# Patient Record
Sex: Female | Born: 1970 | Race: Black or African American | Hispanic: No | Marital: Married | Smoking: Never smoker
Health system: Southern US, Community
[De-identification: ages and names within clinical notes are randomized; demographics above are authoritative.]

## PROBLEM LIST (undated history)

## (undated) DIAGNOSIS — I10 Essential (primary) hypertension: Secondary | ICD-10-CM

## (undated) HISTORY — DX: Essential (primary) hypertension: I10

---

## 1998-03-14 ENCOUNTER — Emergency Department (HOSPITAL_COMMUNITY): Admission: EM | Admit: 1998-03-14 | Discharge: 1998-03-14 | Payer: Self-pay | Admitting: Emergency Medicine

## 1998-05-08 ENCOUNTER — Ambulatory Visit (HOSPITAL_COMMUNITY): Admission: RE | Admit: 1998-05-08 | Discharge: 1998-05-08 | Payer: Self-pay | Admitting: *Deleted

## 1998-07-01 ENCOUNTER — Encounter: Payer: Self-pay | Admitting: *Deleted

## 1998-07-01 ENCOUNTER — Ambulatory Visit (HOSPITAL_COMMUNITY): Admission: RE | Admit: 1998-07-01 | Discharge: 1998-07-01 | Payer: Self-pay | Admitting: *Deleted

## 1998-07-06 HISTORY — PX: TUBAL LIGATION: SHX77

## 1998-07-10 ENCOUNTER — Ambulatory Visit (HOSPITAL_COMMUNITY): Admission: RE | Admit: 1998-07-10 | Discharge: 1998-07-10 | Payer: Self-pay | Admitting: *Deleted

## 1998-10-20 ENCOUNTER — Inpatient Hospital Stay (HOSPITAL_COMMUNITY): Admission: AD | Admit: 1998-10-20 | Discharge: 1998-10-20 | Payer: Self-pay | Admitting: *Deleted

## 1998-10-21 ENCOUNTER — Inpatient Hospital Stay (HOSPITAL_COMMUNITY): Admission: AD | Admit: 1998-10-21 | Discharge: 1998-10-23 | Payer: Self-pay | Admitting: *Deleted

## 1999-04-29 ENCOUNTER — Encounter: Payer: Self-pay | Admitting: Emergency Medicine

## 1999-04-29 ENCOUNTER — Emergency Department (HOSPITAL_COMMUNITY): Admission: EM | Admit: 1999-04-29 | Discharge: 1999-04-29 | Payer: Self-pay | Admitting: Emergency Medicine

## 2001-02-28 ENCOUNTER — Emergency Department (HOSPITAL_COMMUNITY): Admission: EM | Admit: 2001-02-28 | Discharge: 2001-02-28 | Payer: Self-pay | Admitting: Emergency Medicine

## 2001-03-02 ENCOUNTER — Emergency Department (HOSPITAL_COMMUNITY): Admission: EM | Admit: 2001-03-02 | Discharge: 2001-03-02 | Payer: Self-pay | Admitting: Emergency Medicine

## 2001-03-08 ENCOUNTER — Encounter: Admission: RE | Admit: 2001-03-08 | Discharge: 2001-03-08 | Payer: Self-pay | Admitting: Family Medicine

## 2004-09-12 ENCOUNTER — Ambulatory Visit: Payer: Self-pay | Admitting: Family Medicine

## 2004-10-13 ENCOUNTER — Ambulatory Visit: Payer: Self-pay | Admitting: Family Medicine

## 2004-10-14 ENCOUNTER — Encounter: Admission: RE | Admit: 2004-10-14 | Discharge: 2004-10-14 | Payer: Self-pay | Admitting: Sports Medicine

## 2008-11-20 ENCOUNTER — Encounter (INDEPENDENT_AMBULATORY_CARE_PROVIDER_SITE_OTHER): Payer: Self-pay | Admitting: *Deleted

## 2008-11-28 ENCOUNTER — Encounter: Payer: Self-pay | Admitting: Family Medicine

## 2008-11-28 ENCOUNTER — Ambulatory Visit: Payer: Self-pay | Admitting: Family Medicine

## 2008-11-28 DIAGNOSIS — R350 Frequency of micturition: Secondary | ICD-10-CM

## 2008-11-30 ENCOUNTER — Encounter: Payer: Self-pay | Admitting: Family Medicine

## 2009-10-22 ENCOUNTER — Emergency Department (HOSPITAL_COMMUNITY): Admission: EM | Admit: 2009-10-22 | Discharge: 2009-10-22 | Payer: Self-pay | Admitting: Emergency Medicine

## 2010-09-23 LAB — CBC
HCT: 40.9 % (ref 36.0–46.0)
Hemoglobin: 13.9 g/dL (ref 12.0–15.0)
MCHC: 34.1 g/dL (ref 30.0–36.0)
MCV: 92.5 fL (ref 78.0–100.0)
Platelets: 282 10*3/uL (ref 150–400)
RBC: 4.42 MIL/uL (ref 3.87–5.11)
RDW: 12.6 % (ref 11.5–15.5)
WBC: 14 10*3/uL — ABNORMAL HIGH (ref 4.0–10.5)

## 2010-09-23 LAB — POCT CARDIAC MARKERS: Troponin i, poc: <0.05 ng/mL (ref 0.00–0.09)

## 2010-09-24 ENCOUNTER — Other Ambulatory Visit: Payer: Self-pay | Admitting: Family Medicine

## 2010-09-24 ENCOUNTER — Ambulatory Visit (INDEPENDENT_AMBULATORY_CARE_PROVIDER_SITE_OTHER): Payer: BC Managed Care – PPO | Admitting: Family Medicine

## 2010-09-24 ENCOUNTER — Encounter: Payer: Self-pay | Admitting: Family Medicine

## 2010-09-24 DIAGNOSIS — J309 Allergic rhinitis, unspecified: Secondary | ICD-10-CM | POA: Insufficient documentation

## 2010-09-24 DIAGNOSIS — Z23 Encounter for immunization: Secondary | ICD-10-CM

## 2010-09-24 DIAGNOSIS — Z1231 Encounter for screening mammogram for malignant neoplasm of breast: Secondary | ICD-10-CM

## 2010-09-24 DIAGNOSIS — N9089 Other specified noninflammatory disorders of vulva and perineum: Secondary | ICD-10-CM

## 2010-09-24 DIAGNOSIS — Z0000 Encounter for general adult medical examination without abnormal findings: Secondary | ICD-10-CM

## 2010-09-24 DIAGNOSIS — N907 Vulvar cyst: Secondary | ICD-10-CM

## 2010-09-24 LAB — LIPID PANEL
Cholesterol: 208 mg/dL — ABNORMAL HIGH (ref 0–200)
HDL: 58 mg/dL (ref >39)
Total CHOL/HDL Ratio: 3.6 Ratio
Triglycerides: 140 mg/dL (ref <150)

## 2010-09-24 LAB — TSH: TSH: 1.994 u[IU]/mL (ref 0.350–4.500)

## 2010-09-24 MED ORDER — FLUTICASONE FUROATE 27.5 MCG/SPRAY NA SUSP: NASAL | Status: DC

## 2010-09-24 NOTE — Progress Notes (Signed)
  Subjective:    Patient ID: Sara Dorsey, female    DOB: 09-Nov-1970, 40 y.o.   MRN: [id number]  HPI  Here for CPE Only issues: 1) allergy symptoms in last 1-2 months. Has not reallt been bothered by them much befoe this. Is taking allegra d with some benefit 2) boil on her genital area--has been there year or longer. muldly tender. No d/c  Review of Systems    copmplete 14point ros is negative except as in hpi Objective:   Physical Exam  Constitutional: She appears well-developed and well-nourished.  HENT:  Right Ear: External ear normal.  Left Ear: External ear normal.  Eyes: Conjunctivae are normal. Pupils are equal, round, and reactive to light.  Neck: Normal range of motion. Neck supple.  Cardiovascular: Normal rate, normal heart sounds and intact distal pulses.   Pulmonary/Chest: Effort normal and breath sounds normal.       Breasts bilaterally are symmertical, mild fibrocystic changes, no worrisome masses. Normal nipple and areola. Normal axilla  Abdominal: Soft. Bowel sounds are normal.  Genitourinary:       Deferred pelvic exam (on menses), not due pap. Left labia majora has 6 mm mildly tender nodule.  Skin: Skin is warm and dry.       Complete skin exam negative for worrisome lesions          Assessment & Plan:

## 2010-09-24 NOTE — Patient Instructions (Signed)
It was great to see you!! Please set up your mammogram at your convenience. Let's try you on some Zyrtec D instead of the allegra D--it is once a day.  I am also starting you on a nasal spray--one spray in each nostril twice a day. I have called it in to your pharmacy. Let me see you back in 4 weeks to evaluate that again--and also to recheck your blood pressure. I will send you a note about your lab work. On the way out, please make an appointment for [facility] here--it is usually on a Thursday morning--you will need a 30 minute appt. We will remove the cyst then.

## 2010-09-24 NOTE — Assessment & Plan Note (Signed)
Will switch to zyrtec D, add fluticasone nasal and rtc 1 m

## 2010-09-24 NOTE — Assessment & Plan Note (Signed)
Breast exam. Info given for mammo Pap in 2010 normal. Discussed exercise checklabs Taking appropriate calcium

## 2010-09-24 NOTE — Assessment & Plan Note (Signed)
Return for excision. 

## 2010-09-25 ENCOUNTER — Encounter: Payer: Self-pay | Admitting: Family Medicine

## 2010-09-25 LAB — COMPLETE METABOLIC PANEL WITH GFR
Albumin: 4.4 g/dL (ref 3.5–5.2)
Alkaline Phosphatase: 60 U/L (ref 39–117)
BUN: 8 mg/dL (ref 6–23)
Creat: 0.77 mg/dL (ref 0.40–1.20)
GFR, Est Non African American: >60 mL/min (ref >60)
Glucose, Bld: 89 mg/dL (ref 70–99)
Total Bilirubin: 0.6 mg/dL (ref 0.3–1.2)

## 2010-09-26 ENCOUNTER — Telehealth: Payer: Self-pay | Admitting: Family Medicine

## 2010-09-30 NOTE — Telephone Encounter (Signed)
PA required for veramyst. Form placed in MD box.

## 2010-10-01 ENCOUNTER — Other Ambulatory Visit: Payer: Self-pay | Admitting: Family Medicine

## 2010-10-01 NOTE — Telephone Encounter (Signed)
Done and faxed Changed to generic fluticasone

## 2010-10-02 ENCOUNTER — Encounter: Payer: Self-pay | Admitting: Family Medicine

## 2010-10-02 ENCOUNTER — Ambulatory Visit (INDEPENDENT_AMBULATORY_CARE_PROVIDER_SITE_OTHER): Payer: BC Managed Care – PPO | Admitting: Family Medicine

## 2010-10-02 DIAGNOSIS — N9089 Other specified noninflammatory disorders of vulva and perineum: Secondary | ICD-10-CM

## 2010-10-02 DIAGNOSIS — L918 Other hypertrophic disorders of the skin: Secondary | ICD-10-CM | POA: Insufficient documentation

## 2010-10-02 DIAGNOSIS — N907 Vulvar cyst: Secondary | ICD-10-CM

## 2010-10-02 DIAGNOSIS — L919 Hypertrophic disorder of the skin, unspecified: Secondary | ICD-10-CM

## 2010-10-02 NOTE — Progress Notes (Signed)
   Procedure Note:  Excision of sebaceous cyst of left Labia  Informed Consent Obtained for excision of left labial cyst sharply with local anesthetic and simple one layer closure with non-absorbable suture. All Risks, benefits, alternative, complications reviewed with patient and understood. Time Out performed. Using 2% lidocaine with epi. local anesthesia was obtained. Using pickups, hemostats, scissors, and an 11 blade sharp dissection and blunt dissection were used to enucleate the sebaceous cyst. Care was taken to avoid opening the capsule. After most the cyst was exposed a small incision was made and sebaceous material removed. The remaining capsule was removed sharply without any remaining cyst contents left in the wound. The incision was dried with 4x4 sterile dressings. The wound was closed with a one layer simple closure with 3-0 prolene suture.  All hemostasis was achieved.  The patient tolerated the procedure well. No complications occurred. Patient counseled on post-operative care.  Procedure Note:  Removal of Skin Tag sharply  Informed Consent Obtained for above procedure. All Risks, benefits, alternative, complications reviewed with patient and understood. Time Out performed. Using no local anesthesia. Using a hemostat the stalk was crushed and hemostasis bluntly achieved. A regular scissors was used to cut the stalk. The patient tolerated the procedure well. No complications occurred. All hemostasis obtained. Patient counseled on post-operative care.

## 2010-10-02 NOTE — Progress Notes (Signed)
  Subjective:    Patient ID: Sara Dorsey, female    DOB: 08-18-1970, 40 y.o.   MRN: [id number]  HPI 1. Labial cyst Patient is known to Dr. Jennette Kettle. She has a small 1x1 cm labial cyst on her left labia majora. The cyst has no features suggesting infection/inflammation. She has no fever or STD symptoms. She is not having high risk sex. The patient states the labial cyst is causing her irritation and pain especially when working out. 2. Skin tag neck Patient has a small 2 mm skin tag on the right neck that causes her pain because it catches on clothing and jewelry.   Review of Systems No fever, no constitutional changes. No vaginal discharge. No high risk sex. No urinary symptoms.    Objective:   Physical Exam  HENT:  Head:         Small pedunculated skin tag. No other lesions. No suspicious moles.  Genitourinary:          1x1 cm labial cyst. No fluctuance, no induration, no erythema, no warmth, no discharge. Non tender to palpation. No other lesions present       Assessment & Plan:  Pt. Is a 40 y/o aaf with labial sebaceous cyst and skin tag. 1. Labial cyst - excised sharply with scalpel and local anesthesia and simple one layer closure with prolene 3-0 sutures, all hemostasis achieved, no complications. 2, Skin Tag - sharply removed with scissors and local hemostasis with finger pressure. No complications.

## 2010-10-02 NOTE — Progress Notes (Deleted)
  Subjective:    Patient ID: Sara Dorsey, female    DOB: [birth year], 40 y.o.   MRN: [id number]  HPI    Review of Systems     Objective:   Physical Exam        Assessment & Plan:

## 2010-10-02 NOTE — Patient Instructions (Signed)
Please come back next Wednesday AM for suture removal. There will  NOT be a copay for  That aoppointment.

## 2010-10-03 ENCOUNTER — Encounter: Payer: Self-pay | Admitting: Family Medicine

## 2010-10-03 NOTE — Progress Notes (Signed)
  Subjective:    Patient ID: Sara Dorsey, female    DOB: 1970/07/30, 40 y.o.   MRN: [id number]  HPI  Removal of lesion on vulva  Review of Systems Pertinent review of systems: negative for fever or unusual weight change.    no new genital issues. No urinary symptoms Objective:   Physical Exam     Large cyst left labia majora at the fold. Firm.  Assessment & Plan:

## 2010-10-08 ENCOUNTER — Ambulatory Visit: Payer: BC Managed Care – PPO | Admitting: Family Medicine

## 2010-10-08 DIAGNOSIS — Z4802 Encounter for removal of sutures: Secondary | ICD-10-CM

## 2010-10-08 NOTE — Progress Notes (Signed)
Patient in for suture removal. Dr. [place] consulted and she removed 3 sutures from labia that were placed after  cyst removal on 10/02/2010 .  Area looks well healed , no drainage or redness.

## 2010-10-28 ENCOUNTER — Ambulatory Visit
Admission: RE | Admit: 2010-10-28 | Discharge: 2010-10-28 | Disposition: A | Discharge status: Home / Self Care | Payer: BC Managed Care – PPO | Source: Ambulatory Visit | Attending: Family Medicine | Admitting: Family Medicine

## 2010-10-28 DIAGNOSIS — Z1231 Encounter for screening mammogram for malignant neoplasm of breast: Secondary | ICD-10-CM

## 2011-09-16 ENCOUNTER — Other Ambulatory Visit: Payer: Self-pay | Admitting: Family Medicine

## 2011-09-16 DIAGNOSIS — Z1231 Encounter for screening mammogram for malignant neoplasm of breast: Secondary | ICD-10-CM

## 2011-10-07 ENCOUNTER — Encounter: Payer: Self-pay | Admitting: Family Medicine

## 2011-10-07 ENCOUNTER — Other Ambulatory Visit (HOSPITAL_COMMUNITY)
Admission: RE | Admit: 2011-10-07 | Discharge: 2011-10-07 | Disposition: A | Discharge status: Home / Self Care | Payer: BC Managed Care – PPO | Source: Ambulatory Visit | Attending: Family Medicine | Admitting: Family Medicine

## 2011-10-07 ENCOUNTER — Ambulatory Visit (INDEPENDENT_AMBULATORY_CARE_PROVIDER_SITE_OTHER): Payer: BC Managed Care – PPO | Admitting: Family Medicine

## 2011-10-07 VITALS — BP 142/80 | HR 76 | Temp 98.4°F | Ht 62.0 in | Wt 190.0 lb

## 2011-10-07 DIAGNOSIS — L909 Atrophic disorder of skin, unspecified: Secondary | ICD-10-CM

## 2011-10-07 DIAGNOSIS — Z124 Encounter for screening for malignant neoplasm of cervix: Secondary | ICD-10-CM

## 2011-10-07 DIAGNOSIS — R030 Elevated blood-pressure reading, without diagnosis of hypertension: Secondary | ICD-10-CM

## 2011-10-07 DIAGNOSIS — Z01419 Encounter for gynecological examination (general) (routine) without abnormal findings: Secondary | ICD-10-CM | POA: Insufficient documentation

## 2011-10-07 DIAGNOSIS — L918 Other hypertrophic disorders of the skin: Secondary | ICD-10-CM

## 2011-10-07 MED ORDER — FLUTICASONE PROPIONATE 50 MCG/ACT NA SUSP: 2.0000 | Freq: Every day | NASAL | Status: DC

## 2011-10-07 NOTE — Patient Instructions (Signed)
For your blood pressure I would like to see he start walking 20-30 minutes a day, 4-5 days a week. If you can get more days and that's fine. Please keep this on the calendar as that tends to make this a little bit more consistent.  However also like you. to consider some mild weight loss. There are  multiple way she can do this. On eway that I have seen  has seen  work in many of my patients and has worked for me in the past is [employer]. They have been on long course now.    There are many other ways to lose weight. I do not like fad diets. I do think getting back on portion sizes by 30% sometimes the best way for most people to lose weight. Certainly avoiding those things that we know are bad for [place] like cakes and pies and soft drinks events diet soft drinks should be kept to a minimum.  Start both of these things have been let me see you back in 6 weeks. We'll recheck her blood pressure then. If you can get a few readings done at a drugstore and copy them down and bring them with you we will be very helpful.  It is great to see you. I will send you know that your Pap smear.

## 2011-10-07 NOTE — Progress Notes (Signed)
  Subjective:    Patient ID: Sara Dorsey, female    DOB: 09-13-1970, 41 y.o.   MRN: [id number]  HPI cpe Allergies pretty well controlled on nasal spray. Has gone back to work part time and is enjoying that   Review of Systems  Constitutional: Negative for activity change, fatigue and unexpected weight change.  HENT: Positive for rhinorrhea. Negative for sneezing.   Eyes: Negative for pain and visual disturbance.  Respiratory: Negative for cough, chest tightness and shortness of breath.   Cardiovascular: Negative for chest pain and leg swelling.  Gastrointestinal: Negative for abdominal pain.  Genitourinary: Negative for urgency, vaginal discharge and menstrual problem.  Musculoskeletal: Negative for myalgias, joint swelling and arthralgias.  Skin: Negative for rash.  Neurological: Negative for syncope and light-headedness.  Psychiatric/Behavioral: Negative for dysphoric mood.       Objective:   Physical Exam  Constitutional: She is oriented to person, place, and time. She appears well-developed and well-nourished.  HENT:  Right Ear: External ear normal.  Left Ear: External ear normal.  Nose: Nose normal.  Mouth/Throat: Oropharynx is clear and moist.  Eyes: Conjunctivae and EOM are normal. Pupils are equal, round, and reactive to light.  Neck: Normal range of motion. Neck supple. No thyromegaly present.  Cardiovascular: Normal rate, regular rhythm, normal heart sounds and intact distal pulses.   Pulmonary/Chest: Effort normal and breath sounds normal.  Abdominal: Soft. Bowel sounds are normal. There is no tenderness.  Genitourinary: Vagina normal and uterus normal. Cervix exhibits no motion tenderness and no discharge. Right adnexum displays no mass, no tenderness and no fullness. Left adnexum displays no mass, no tenderness and no fullness.  Musculoskeletal: Normal range of motion.  Lymphadenopathy:    She has no cervical adenopathy.  Neurological: She is alert and  oriented to person, place, and time. She has normal reflexes.  Skin: Skin is warm. No rash noted.  Psychiatric: She has a normal mood and affect. Her behavior is normal. Judgment and thought content normal.  skin tag under left breat---pedunculated and irritated--benign in appearance        Assessment & Plan:  1. cpe with pap, breat exam 2. Skin tag removal 3. Preventive health---she has mammo scheduled later this month Tdap utd 4. Borderline htn---discussed---start exercise and weight loss plan---see pt instructions---take BP and diary numbers, rtc 6-8 w.

## 2011-10-09 ENCOUNTER — Encounter: Payer: Self-pay | Admitting: Family Medicine

## 2011-10-29 ENCOUNTER — Ambulatory Visit
Admission: RE | Admit: 2011-10-29 | Discharge: 2011-10-29 | Disposition: A | Discharge status: Home / Self Care | Payer: BC Managed Care – PPO | Source: Ambulatory Visit | Attending: Family Medicine | Admitting: Family Medicine

## 2011-10-29 ENCOUNTER — Ambulatory Visit: Payer: BC Managed Care – PPO

## 2011-10-29 DIAGNOSIS — Z1231 Encounter for screening mammogram for malignant neoplasm of breast: Secondary | ICD-10-CM

## 2011-11-11 ENCOUNTER — Ambulatory Visit (INDEPENDENT_AMBULATORY_CARE_PROVIDER_SITE_OTHER): Payer: BC Managed Care – PPO | Admitting: Family Medicine

## 2011-11-11 ENCOUNTER — Encounter: Payer: Self-pay | Admitting: Family Medicine

## 2011-11-11 VITALS — BP 132/82 | HR 59 | Temp 98.5°F | Ht 62.0 in | Wt 186.5 lb

## 2011-11-11 DIAGNOSIS — R030 Elevated blood-pressure reading, without diagnosis of hypertension: Secondary | ICD-10-CM

## 2011-11-11 NOTE — Progress Notes (Signed)
  Subjective:    Patient ID: Sara Dorsey, female    DOB: December 20, 1970, 41 y.o.   MRN: [id number]  HPI  Followup elevated blood pressure without diagnosis of hypertension. She has been trying to really watch her diet and has started walking some. She has lost 6 pounds. She did not bring her blood pressure readings with her but does not think they were elevated above the 130s systolic on any of the readings. She's having no new symptoms.  Review of Systems Denies chest pain, shortness of breath.    Objective:   Physical Exam   GENERAL: Well-developed female no acute distress VITAL signs: Reviewed.  CV: Regular rate and rhythm Assessment & Plan:

## 2011-11-11 NOTE — Assessment & Plan Note (Signed)
   A last him modifications in her 6 pound weight loss. We discussed at some length appropriate weight for her I gave her a BMI table. I'll see her back in 3 months. Hopefully she will have lost another 6 pounds by then. She'll try to get some blood pressure readings and bring them with her.

## 2012-11-18 ENCOUNTER — Other Ambulatory Visit: Payer: Self-pay

## 2012-11-18 DIAGNOSIS — Z1231 Encounter for screening mammogram for malignant neoplasm of breast: Secondary | ICD-10-CM

## 2012-12-26 ENCOUNTER — Ambulatory Visit: Payer: BC Managed Care – PPO

## 2012-12-28 ENCOUNTER — Encounter: Payer: BC Managed Care – PPO | Admitting: Family Medicine

## 2013-07-26 ENCOUNTER — Ambulatory Visit: Payer: BC Managed Care – PPO

## 2013-08-23 ENCOUNTER — Encounter: Payer: BC Managed Care – PPO | Admitting: Family Medicine

## 2013-10-26 ENCOUNTER — Other Ambulatory Visit: Payer: Self-pay | Admitting: Family Medicine

## 2015-04-09 ENCOUNTER — Other Ambulatory Visit: Payer: Self-pay | Admitting: *Deleted

## 2015-04-09 MED ORDER — FLUTICASONE PROPIONATE 50 MCG/ACT NA SUSP: 2.0000 | Freq: Every day | NASAL | Status: DC

## 2015-04-10 ENCOUNTER — Telehealth: Payer: Self-pay | Admitting: Family Medicine

## 2015-04-10 NOTE — Telephone Encounter (Signed)
Needs refill on [facility] on [address]

## 2015-04-15 MED ORDER — FLUTICASONE PROPIONATE 50 MCG/ACT NA SUSP: 2.0000 | Freq: Every day | NASAL | Status: DC

## 2015-04-15 NOTE — Telephone Encounter (Signed)
Medication sent to [facility] on [address] per patient request.  Derl Barrow, RN

## 2015-04-15 NOTE — Telephone Encounter (Signed)
Pt husband calling and states that this medication (Flonase) was sent to [facility] on [address] whereas it needed to be sent to [facility] on [address]. Please advise pt once fixed. Thank you, Fonda Kinder, ASA

## 2015-12-18 ENCOUNTER — Other Ambulatory Visit: Payer: Self-pay | Admitting: Family Medicine

## 2015-12-18 ENCOUNTER — Ambulatory Visit (INDEPENDENT_AMBULATORY_CARE_PROVIDER_SITE_OTHER): Payer: Self-pay | Admitting: Family Medicine

## 2015-12-18 ENCOUNTER — Encounter: Payer: Self-pay | Admitting: Family Medicine

## 2015-12-18 VITALS — BP 157/86 | HR 61 | Temp 98.3°F | Ht 62.0 in | Wt 189.6 lb

## 2015-12-18 DIAGNOSIS — I10 Essential (primary) hypertension: Secondary | ICD-10-CM

## 2015-12-18 DIAGNOSIS — Z1231 Encounter for screening mammogram for malignant neoplasm of breast: Secondary | ICD-10-CM

## 2015-12-18 MED ORDER — LISINOPRIL-HYDROCHLOROTHIAZIDE 20-25 MG PO TABS: 1.0000 | ORAL_TABLET | Freq: Every day | ORAL | Status: DC

## 2015-12-18 NOTE — Patient Instructions (Addendum)
We are starting a new medicine for your blood pressure. It is called LISINORETIC and should be on the $4 list  (usually $12 for 3 months worth )  Take one a day, EVERY DAY.   Call me with any questions or concerns.   Take a blood pressure reading once or twice a week over the next 4 weeks and bring those readings with you to next visit.   See me in 4 weeks and we will do your pap smear then  greatto see you back!

## 2015-12-18 NOTE — Progress Notes (Signed)
   Subjective:    Patient ID: Sara Dorsey, female    DOB: 1970-08-25, 45 y.o.   MRN: [id number]  HPI Reestablish care. Has not been seen for 3 years. Mostly was avoiding a doctor secondary to financial limitations. Next line #1. Occasionally notes some tingling in her bilateral hands. This does not seem to be related to activity or sleep. Usually resolves on its on. Has seen no skin changes. No hand weakness. #2. Blood pressure: She does not recall that we were following her blood pressure cause closely at her last office visit 2013. She does not recall getting any blood pressure readings in the interim. She denies headache, denies chest pain. No change in her exercise tolerance. She is active but no specific exercise program. Nonsmoker. No illicits.  PERTINENT  PMH / PSH: I have reviewed the patient's medications, allergies, past medical and surgical history. Pertinent findings that relate to today's visit / issues include: No personal history diabetes mellitus. Family history significant for mother with hypertension.    Review of Systems See history of present illness.     Objective:   Physical Exam Vital signs reviewed. GENERAL: Well-developed, well-nourished, no acute distress. CARDIOVASCULAR: Regular rate and rhythm no murmur gallop or rub LUNGS: Clear to auscultation bilaterally, no rales or wheeze. ABDOMEN: Soft positive bowel sounds NEURO: No gross focal neurological deficits. MSK: Movement of extremity x 4. NECK: Thyroid is somewhat generous but there's no nodularity. Nontender. No JVD VASCULAR: Intact and symmetrical dorsalis pedis, posterior tibialis, radial pulses.        Assessment & Plan:

## 2015-12-19 ENCOUNTER — Other Ambulatory Visit: Payer: Self-pay | Admitting: Family Medicine

## 2015-12-19 LAB — BASIC METABOLIC PANEL WITH GFR
BUN: 10 mg/dL (ref 7–25)
CHLORIDE: 104 mmol/L (ref 98–110)
CO2: 19 mmol/L — AB (ref 20–31)
CREATININE: 0.82 mg/dL (ref 0.50–1.10)
Calcium: 9.4 mg/dL (ref 8.6–10.2)
GFR, EST NON AFRICAN AMERICAN: 85 mL/min (ref >=60)
GLUCOSE: 83 mg/dL (ref 65–99)
POTASSIUM: 4 mmol/L (ref 3.5–5.3)
SODIUM: 137 mmol/L (ref 135–146)

## 2015-12-19 LAB — TSH: TSH: 2.02 mIU/L

## 2015-12-19 MED ORDER — FLUTICASONE PROPIONATE 50 MCG/ACT NA SUSP: 2.0000 | Freq: Every day | NASAL | Status: DC

## 2015-12-23 ENCOUNTER — Encounter: Payer: Self-pay | Admitting: Family Medicine

## 2015-12-30 ENCOUNTER — Ambulatory Visit
Admission: RE | Admit: 2015-12-30 | Discharge: 2015-12-30 | Disposition: A | Discharge status: Home / Self Care | Payer: No Typology Code available for payment source | Source: Ambulatory Visit | Attending: Family Medicine | Admitting: Family Medicine

## 2015-12-30 DIAGNOSIS — Z1231 Encounter for screening mammogram for malignant neoplasm of breast: Secondary | ICD-10-CM

## 2016-01-15 ENCOUNTER — Encounter: Payer: Self-pay | Admitting: Family Medicine

## 2016-01-15 ENCOUNTER — Ambulatory Visit (INDEPENDENT_AMBULATORY_CARE_PROVIDER_SITE_OTHER): Payer: Self-pay | Admitting: Family Medicine

## 2016-01-15 ENCOUNTER — Other Ambulatory Visit (HOSPITAL_COMMUNITY)
Admission: RE | Admit: 2016-01-15 | Discharge: 2016-01-15 | Disposition: A | Discharge status: Home / Self Care | Payer: No Typology Code available for payment source | Source: Ambulatory Visit | Attending: Family Medicine | Admitting: Family Medicine

## 2016-01-15 VITALS — HR 76 | Temp 98.5°F | Ht 62.0 in | Wt 184.4 lb

## 2016-01-15 DIAGNOSIS — Z1151 Encounter for screening for human papillomavirus (HPV): Secondary | ICD-10-CM | POA: Insufficient documentation

## 2016-01-15 DIAGNOSIS — Z124 Encounter for screening for malignant neoplasm of cervix: Secondary | ICD-10-CM

## 2016-01-15 DIAGNOSIS — Z0000 Encounter for general adult medical examination without abnormal findings: Secondary | ICD-10-CM

## 2016-01-15 DIAGNOSIS — Z01411 Encounter for gynecological examination (general) (routine) with abnormal findings: Secondary | ICD-10-CM | POA: Insufficient documentation

## 2016-01-15 DIAGNOSIS — I10 Essential (primary) hypertension: Secondary | ICD-10-CM

## 2016-01-15 LAB — BASIC METABOLIC PANEL WITH GFR
BUN: 16 mg/dL (ref 7–25)
CALCIUM: 9.3 mg/dL (ref 8.6–10.2)
CO2: 24 mmol/L (ref 20–31)
CREATININE: 1.01 mg/dL (ref 0.50–1.10)
Chloride: 101 mmol/L (ref 98–110)
GFR, EST AFRICAN AMERICAN: 77 mL/min (ref >=60)
GFR, Est Non African American: 66 mL/min (ref >=60)
GLUCOSE: 90 mg/dL (ref 65–99)
Potassium: 3.8 mmol/L (ref 3.5–5.3)
Sodium: 134 mmol/L — ABNORMAL LOW (ref 135–146)

## 2016-01-15 LAB — LIPID PANEL
Cholesterol: 203 mg/dL — ABNORMAL HIGH (ref 125–200)
HDL: 58 mg/dL (ref >=46)
LDL CALC: 117 mg/dL (ref <130)
Total CHOL/HDL Ratio: 3.5 Ratio (ref <=5.0)
Triglycerides: 142 mg/dL (ref <150)
VLDL: 28 mg/dL (ref <30)

## 2016-01-15 NOTE — Patient Instructions (Signed)
Great to see you Let me know when Sara Dorsey wants to observe a sports medicine clinic with me!

## 2016-01-15 NOTE — Progress Notes (Signed)
   Subjective:    Patient ID: Sara Dorsey, female    DOB: May 18, 1971, 45 y.o.   MRN: [id number]  HPI F/u BP med addition Feeling much better on the medicine. No headaches. No shortness of breath, no cough. He's taken her blood pressure a few times and it's always less then 140 over the top number and less than 90 on the bottom number.  #2. Needs Pap smear  Review of Systems See history of present illness    Objective:   Physical Exam Vital signs are reviewed GEN.: Well-developed female no acute distress CV: Regular rate and rhythm GU: Externally normal. No adnexal masses or tenderness. Cervix appears normal. Pap is obtained       Assessment & Plan:  Pap smear obtained today

## 2016-01-15 NOTE — Assessment & Plan Note (Signed)
Doing well on current medication which we will continue. Briefly discuss again side effects such as angioedema, cough. She's had neither of these. We'll recheck her kidney function and potassium on the new medication and also her note about that. Follow-up one year for blood pressure management, sooner if problems.

## 2016-01-16 LAB — HIV ANTIBODY (ROUTINE TESTING W REFLEX): HIV: NONREACTIVE

## 2016-01-16 LAB — CYTOLOGY - PAP

## 2016-01-17 ENCOUNTER — Encounter: Payer: Self-pay | Admitting: Family Medicine

## 2016-01-17 DIAGNOSIS — R87610 Atypical squamous cells of undetermined significance on cytologic smear of cervix (ASC-US): Secondary | ICD-10-CM | POA: Insufficient documentation

## 2017-02-12 ENCOUNTER — Telehealth: Payer: Self-pay | Admitting: Family Medicine

## 2017-02-12 MED ORDER — OLMESARTAN MEDOXOMIL-HCTZ 20-12.5 MG PO TABS: 1.0000 | ORAL_TABLET | Freq: Every day | ORAL | 3 refills | Status: DC

## 2017-02-12 NOTE — Telephone Encounter (Signed)
Pt states she quit taking the lisinopril and recently started taking it again and now pt is experiencing nausea, abdominal pain and cough. Pt would like PCP or a nurse to call her. ep

## 2017-02-12 NOTE — Telephone Encounter (Signed)
Return call to patient regarding Lisinopril reaction.  Patient reported cough, fatigue, abdominal cramping.  Patient stop taking the medication for while and then recently restarted.  Patient started with the same symptoms.  Last dose was taking this morning.  Advised patient not to take anymore of the medication. Message will be forwarded to PCP for medication change.  Pt voiced understanding.  Derl Barrow, RN

## 2017-02-12 NOTE — Telephone Encounter (Signed)
Patient advised of Dr. Verlon Au note from below.  Medication taken once daily.  Patient to follow up as needed.  Derl Barrow, RN

## 2017-02-12 NOTE — Telephone Encounter (Signed)
RN team I am switching her to olmesarton HCTZ to replace her previous lisiniopril HCTZ. If she has any trouble with cough after about 4 weeks of new medicine, let me know. I say 4 weeks because it may take that long for the side effect of cough to go away from the previous medicine. It would be unusual for the abdominal pain to be related to her medicine at all since that continues, she needs to be seen so we can evaluate the cause of that. THANKS! Dorcas Mcmurray .

## 2018-06-30 ENCOUNTER — Telehealth: Payer: Self-pay

## 2018-06-30 NOTE — Telephone Encounter (Signed)
Copied from [place] [phone]. Topic: Appointment Scheduling - New Patient >> Jun 30, 2018  3:49 PM Virl Axe D wrote: New patient has been scheduled for your office. Provider: Volanda Napoleon Date of Appointment: 07/20/18  Route to department's PEC pool.

## 2018-07-07 ENCOUNTER — Ambulatory Visit: Payer: Self-pay | Admitting: Family Medicine

## 2018-07-20 ENCOUNTER — Ambulatory Visit: Payer: No Typology Code available for payment source | Admitting: Family Medicine

## 2019-01-07 ENCOUNTER — Other Ambulatory Visit: Payer: Self-pay

## 2019-01-07 ENCOUNTER — Telehealth: Payer: Self-pay

## 2019-01-07 ENCOUNTER — Ambulatory Visit (HOSPITAL_COMMUNITY)
Admission: EM | Admit: 2019-01-07 | Discharge: 2019-01-07 | Disposition: A | Discharge status: Home / Self Care | Payer: No Typology Code available for payment source | Attending: Family Medicine | Admitting: Family Medicine

## 2019-01-07 DIAGNOSIS — R059 Cough, unspecified: Secondary | ICD-10-CM

## 2019-01-07 DIAGNOSIS — Z20828 Contact with and (suspected) exposure to other viral communicable diseases: Secondary | ICD-10-CM

## 2019-01-07 DIAGNOSIS — R05 Cough: Secondary | ICD-10-CM

## 2019-01-07 DIAGNOSIS — Z20822 Contact with and (suspected) exposure to covid-19: Secondary | ICD-10-CM

## 2019-01-07 MED ORDER — GUAIFENESIN-CODEINE 100-10 MG/5ML PO SOLN: 5.0000 mL | Freq: Every evening | ORAL | 0 refills | Status: DC | PRN

## 2019-01-07 NOTE — ED Triage Notes (Signed)
Per pt she has been having a cough that is dry and worsens at night. Pt  Has been taking otc meds but no relief. No fevers no chills

## 2019-01-07 NOTE — Telephone Encounter (Signed)
-----   Message from Sharion Balloon, NP sent at 01/07/2019 12:04 PM EDT ----- Regarding: Need COVID test

## 2019-01-07 NOTE — Discharge Instructions (Addendum)
You will receive a call shortly to schedule your COVID test.  While you are waiting for the results, you should self quarantine.  You can continue to take Mucinex during the day and can take the prescribed Robitussin with codeine at bedtime to help you sleep without cough.  Follow-up with your primary care provider in 1 week.  Return here or go to the emergency department if you develop fever, chills, yellow sputum, vomiting, diarrhea, headaches, shortness of breath, chest pain.

## 2019-01-07 NOTE — ED Provider Notes (Signed)
[place]    CSN: [id number] Arrival date & time: 01/07/19  1026     History   Chief Complaint Chief Complaint  Patient presents with  . Cough    HPI Sara Dorsey is a 48 y.o. female.   She presents today with a 69-month history of dry cough, worse since yesterday when working in yard.  She reports intermittent sore throat and runny nose which is not currently present.  She also reports change in her ability to smell.  She has treated the times at home with Mucinex, lemon, ginger without relief.  She denies fever, chills, sputum production, vomiting, diarrhea, headaches, shortness of breath, chest pain.  LMP: 12/14/2018, tubal ligation.  The history is provided by the patient.    No past medical history on file.  Patient Active Problem List   Diagnosis Date Noted  . Atypical squamous cells of undetermined significance (ASCUS) on Papanicolaou smear of cervix 01/17/2016  . Essential hypertension, benign 12/18/2015  . Well adult exam 09/24/2010  . Allergic rhinitis 09/24/2010      OB History   No obstetric history on file.      Home Medications    Prior to Admission medications   Medication Sig Start Date End Date Taking? Authorizing Provider  fluticasone (FLONASE) 50 MCG/ACT nasal spray Place 2 sprays into both nostrils daily. 12/19/15   Dickie La, MD  guaiFENesin-codeine 100-10 MG/5ML syrup Take 5 mLs by mouth at bedtime as needed for cough. 01/07/19   Sharion Balloon, NP  olmesartan-hydrochlorothiazide (BENICAR HCT) 20-12.5 MG tablet Take 1 tablet by mouth daily. 02/12/17   Dickie La, MD    Family History No family history on file.  Social History Social History   Tobacco Use  . Smoking status: Never Smoker  Substance Use Topics  . Alcohol use: Not on file  . Drug use: Not on file     Allergies   Lisinopril   Review of Systems Review of Systems  Constitutional: Negative for chills and fever.  HENT: Negative for ear pain,  rhinorrhea, sore throat and trouble swallowing.   Eyes: Negative for pain and visual disturbance.  Respiratory: Positive for cough. Negative for chest tightness and shortness of breath.   Cardiovascular: Negative for chest pain and palpitations.  Gastrointestinal: Negative for abdominal pain and vomiting.  Genitourinary: Negative for dysuria and hematuria.  Musculoskeletal: Negative for arthralgias and back pain.  Skin: Negative for color change and rash.  Neurological: Negative for seizures and syncope.  All other systems reviewed and are negative.    Physical Exam Triage Vital Signs ED Triage Vitals  Enc Vitals Group     BP 01/07/19 1051 (!) 167/108     Pulse --      Resp 01/07/19 1051 16     Temp 01/07/19 1051 98.4 F (36.9 C)     Temp Source 01/07/19 1051 Oral     SpO2 01/07/19 1051 98 %     Weight --      Height --      Head Circumference --      Peak Flow --      Pain Score 01/07/19 1052 0     Pain Loc --      Pain Edu? --      Excl. in [place]? --    No data found.  Updated Vital Signs BP (!) 167/108 (BP Location: Right Arm)   Temp 98.4 F (36.9 C) (Oral)   Resp 16  SpO2 98%   Visual Acuity Right Eye Distance:   Left Eye Distance:   Bilateral Distance:    Right Eye Near:   Left Eye Near:    Bilateral Near:     Physical Exam Vitals signs and nursing note reviewed.  Constitutional:      General: She is not in acute distress.    Appearance: She is well-developed.  HENT:     Head: Normocephalic and atraumatic.     Right Ear: Tympanic membrane normal.     Left Ear: Tympanic membrane normal.     Nose: Nose normal.     Mouth/Throat:     Mouth: Mucous membranes are moist.     Pharynx: No oropharyngeal exudate or posterior oropharyngeal erythema.  Eyes:     General:        Right eye: No discharge.        Left eye: No discharge.     Conjunctiva/sclera: Conjunctivae normal.  Neck:     Musculoskeletal: Neck supple. No neck rigidity or muscular tenderness.   Cardiovascular:     Rate and Rhythm: Normal rate and regular rhythm.     Heart sounds: No murmur.  Pulmonary:     Effort: Pulmonary effort is normal. No respiratory distress.     Breath sounds: Normal breath sounds.  Abdominal:     Palpations: Abdomen is soft.     Tenderness: There is no abdominal tenderness.  Lymphadenopathy:     Cervical: No cervical adenopathy.  Skin:    General: Skin is warm and dry.  Neurological:     Mental Status: She is alert.      UC Treatments / Results  Labs (all labs ordered are listed, but only abnormal results are displayed) Labs Reviewed - No data to display  EKG   Radiology No results found.  Procedures Procedures (including critical care time)  Medications Ordered in UC Medications - No data to display  Initial Impression / Assessment and Plan / UC Course  I have reviewed the triage vital signs and the nursing notes.  Pertinent labs & imaging results that were available during my care of the patient were reviewed by me and considered in my medical decision making (see chart for details).   Cough, suspected COVID.  COVID test ordered.  Treating today with over-the-counter Mucinex during the day and prescribed Robitussin with codeine at bedtime.  Follow-up here or in emergency department if she develops fever, chills, productive cough, vomiting, diarrhea, headaches, shortness of breath, chest pain.  Otherwise follow-up with primary care provider in 1 week.  Final Clinical Impressions(s) / UC Diagnoses   Final diagnoses:  Cough  Suspected Covid-19 Virus Infection     Discharge Instructions     You will receive a call shortly to schedule your COVID test.  While you are waiting for the results, you should self quarantine.  You can continue to take Mucinex during the day and can take the prescribed Robitussin with codeine at bedtime to help you sleep without cough.  Follow-up with your primary care provider in 1 week.  Return here  or go to the emergency department if you develop fever, chills, yellow sputum, vomiting, diarrhea, headaches, shortness of breath, chest pain.    ED Prescriptions    Medication Sig Dispense Auth. Provider   guaiFENesin-codeine 100-10 MG/5ML syrup Take 5 mLs by mouth at bedtime as needed for cough. 120 mL Sharion Balloon, NP     Controlled Substance Prescriptions [place] Controlled Substance Registry  consulted? Not Applicable   Sharion Balloon, NP 01/07/19 [phone]

## 2019-01-08 ENCOUNTER — Telehealth: Payer: Self-pay

## 2019-01-08 DIAGNOSIS — Z20822 Contact with and (suspected) exposure to covid-19: Secondary | ICD-10-CM

## 2019-01-08 NOTE — Telephone Encounter (Signed)
-----   Message from Sharion Balloon, NP sent at 01/07/2019 12:04 PM EDT ----- Regarding: Need COVID test

## 2019-01-08 NOTE — Telephone Encounter (Signed)
Called pt and covid testing scheduled 01/10/19 at 3:00 pm. Pt informed to wear mask to testing site, testing site address and to stay in car for testing.

## 2019-01-10 ENCOUNTER — Other Ambulatory Visit: Payer: No Typology Code available for payment source

## 2019-01-10 DIAGNOSIS — Z20822 Contact with and (suspected) exposure to covid-19: Secondary | ICD-10-CM

## 2019-01-15 LAB — NOVEL CORONAVIRUS, NAA: SARS-CoV-2, NAA: NOT DETECTED

## 2019-01-17 ENCOUNTER — Ambulatory Visit (HOSPITAL_COMMUNITY)
Admission: EM | Admit: 2019-01-17 | Discharge: 2019-01-17 | Disposition: A | Discharge status: Home / Self Care | Payer: Self-pay | Attending: Emergency Medicine | Admitting: Emergency Medicine

## 2019-01-17 ENCOUNTER — Encounter (HOSPITAL_COMMUNITY): Payer: Self-pay

## 2019-01-17 ENCOUNTER — Other Ambulatory Visit: Payer: Self-pay

## 2019-01-17 DIAGNOSIS — J209 Acute bronchitis, unspecified: Secondary | ICD-10-CM

## 2019-01-17 MED ORDER — AZITHROMYCIN 250 MG PO TABS: 250.0000 mg | ORAL_TABLET | Freq: Every day | ORAL | 0 refills | Status: DC

## 2019-01-17 NOTE — Discharge Instructions (Signed)
Take the antibiotic Zithromax as prescribed.  Take over-the-counter Claritin or Zyrtec.    Follow-up with your primary care provider in 1 week.  Return here or to the emergency department if you develop fever, chills, shortness of breath, chest pain, vomiting, diarrhea, abdominal pain, other concerning symptoms.

## 2019-01-17 NOTE — ED Provider Notes (Signed)
[employer]    CSN: [id number] Arrival date & time: 01/17/19  3300     History   Chief Complaint Chief Complaint  Patient presents with  . Cough    HPI Sara Dorsey is a 48 y.o. female.   Patient presents with cough x several months; she reports it is now productive of yellow-beige sputum x 1 week.  She was seen here on 01/07/2019 and was treated with a COVID test, over-the-counter Mucinex, and Robitussin with codeine at bedtime.  Her COVID test was negative.  She denies fever, chills, shortness of breath, chest pain, vomiting, diarrhea, abdominal pain, other symptoms.  The history is provided by the patient.    History reviewed. No pertinent past medical history.  Patient Active Problem List   Diagnosis Date Noted  . Atypical squamous cells of undetermined significance (ASCUS) on Papanicolaou smear of cervix 01/17/2016  . Essential hypertension, benign 12/18/2015  . Well adult exam 09/24/2010  . Allergic rhinitis 09/24/2010    History reviewed. No pertinent surgical history.  OB History   No obstetric history on file.      Home Medications    Prior to Admission medications   Medication Sig Start Date End Date Taking? Authorizing Provider  azithromycin (ZITHROMAX) 250 MG tablet Take 1 tablet (250 mg total) by mouth daily. Take first 2 tablets together, then 1 every day until finished. 01/17/19   Sharion Balloon, NP  fluticasone (FLONASE) 50 MCG/ACT nasal spray Place 2 sprays into both nostrils daily. 12/19/15   Dickie La, MD  olmesartan-hydrochlorothiazide (BENICAR HCT) 20-12.5 MG tablet Take 1 tablet by mouth daily. 02/12/17   Dickie La, MD    Family History Family History  Family history unknown: Yes    Social History Social History   Tobacco Use  . Smoking status: Never Smoker  Substance Use Topics  . Alcohol use: Not on file  . Drug use: Not on file     Allergies   Lisinopril   Review of Systems Review of Systems   Constitutional: Negative for chills and fever.  HENT: Negative for ear pain and sore throat.   Eyes: Negative for pain and visual disturbance.  Respiratory: Positive for cough. Negative for shortness of breath.   Cardiovascular: Negative for chest pain and palpitations.  Gastrointestinal: Negative for abdominal pain, diarrhea and vomiting.  Genitourinary: Negative for dysuria and hematuria.  Musculoskeletal: Negative for arthralgias and back pain.  Skin: Negative for color change and rash.  Neurological: Negative for seizures and syncope.  All other systems reviewed and are negative.    Physical Exam Triage Vital Signs ED Triage Vitals  Enc Vitals Group     BP      Pulse      Resp      Temp      Temp src      SpO2      Weight      Height      Head Circumference      Peak Flow      Pain Score      Pain Loc      Pain Edu?      Excl. in [place]?    No data found.  Updated Vital Signs BP (!) 143/98 (BP Location: Left Arm)   Pulse 88   Temp 98.7 F (37.1 C) (Oral)   Resp 17   LMP 01/16/2019   SpO2 100%   Visual Acuity Right Eye Distance:   Left Eye  Distance:   Bilateral Distance:    Right Eye Near:   Left Eye Near:    Bilateral Near:     Physical Exam Vitals signs and nursing note reviewed.  Constitutional:      General: She is not in acute distress.    Appearance: She is well-developed.  HENT:     Head: Normocephalic and atraumatic.     Right Ear: Tympanic membrane normal.     Left Ear: Tympanic membrane normal.     Nose: Nose normal.     Mouth/Throat:     Mouth: Mucous membranes are moist.  Eyes:     Conjunctiva/sclera: Conjunctivae normal.  Neck:     Musculoskeletal: Neck supple.  Cardiovascular:     Rate and Rhythm: Normal rate and regular rhythm.     Heart sounds: No murmur.  Pulmonary:     Effort: Pulmonary effort is normal. No respiratory distress.     Breath sounds: Normal breath sounds.  Abdominal:     Palpations: Abdomen is soft.      Tenderness: There is no abdominal tenderness.  Skin:    General: Skin is warm and dry.  Neurological:     Mental Status: She is alert.      UC Treatments / Results  Labs (all labs ordered are listed, but only abnormal results are displayed) Labs Reviewed - No data to display  EKG   Radiology No results found.  Procedures Procedures (including critical care time)  Medications Ordered in UC Medications - No data to display  Initial Impression / Assessment and Plan / UC Course  I have reviewed the triage vital signs and the nursing notes.  Pertinent labs & imaging results that were available during my care of the patient were reviewed by me and considered in my medical decision making (see chart for details).   Acute bronchitis.  Treating with Z-Pak and over-the-counter Claritin or Zyrtec.  Discussed with patient that she needs to follow-up with her primary care provider in 1 week.  Discussed that she should return here to the emergency department if she develops fever, chills, shortness of breath, chest pain, vomiting, diarrhea, abdominal pain, or other concerning symptoms.    Final Clinical Impressions(s) / UC Diagnoses   Final diagnoses:  Acute bronchitis, unspecified organism     Discharge Instructions     Take the antibiotic Zithromax as prescribed.  Take over-the-counter Claritin or Zyrtec.    Follow-up with your primary care provider in 1 week.  Return here or to the emergency department if you develop fever, chills, shortness of breath, chest pain, vomiting, diarrhea, abdominal pain, other concerning symptoms.        ED Prescriptions    Medication Sig Dispense Auth. Provider   azithromycin (ZITHROMAX) 250 MG tablet Take 1 tablet (250 mg total) by mouth daily. Take first 2 tablets together, then 1 every day until finished. 6 tablet Sharion Balloon, NP     Controlled Substance Prescriptions [place] Controlled Substance Registry consulted? Not Applicable    Sharion Balloon, NP 01/17/19 1058

## 2019-01-17 NOTE — ED Triage Notes (Signed)
Pt presents with ongoing persistent non productive cough with no relief from prescribed cough suppressant.

## 2019-01-25 ENCOUNTER — Encounter: Payer: Self-pay | Admitting: Family Medicine

## 2019-04-19 ENCOUNTER — Other Ambulatory Visit: Payer: Self-pay

## 2019-04-19 ENCOUNTER — Telehealth (INDEPENDENT_AMBULATORY_CARE_PROVIDER_SITE_OTHER): Payer: Self-pay | Admitting: Family Medicine

## 2019-04-19 DIAGNOSIS — R059 Cough, unspecified: Secondary | ICD-10-CM | POA: Insufficient documentation

## 2019-04-19 DIAGNOSIS — R05 Cough: Secondary | ICD-10-CM

## 2019-04-19 MED ORDER — ALBUTEROL SULFATE HFA 108 (90 BASE) MCG/ACT IN AERS: 2.0000 | INHALATION_SPRAY | Freq: Four times a day (QID) | RESPIRATORY_TRACT | 2 refills | Status: DC | PRN

## 2019-04-19 NOTE — Assessment & Plan Note (Addendum)
Prolonged possibly post bronchitis although the ER note from July says she had been having for months before.  She does not have insurance so will first do a trial of Albuterol.  If not marked improved will need a Cxray and in person exam to check her weight and lung exam  Will get insurance in Jan 2021

## 2019-04-19 NOTE — Progress Notes (Signed)
[facility] Telemedicine Visit  Patient consented to have virtual visit. Method of visit: Telephone  Encounter participants: Patient: Sara Dorsey - located at outside office Provider: Lind Covert - located at office Others (if applicable): no  Chief Complaint: Cough  HPI:  Had bronchitis in July and has been coughing ever since.  Was initially treated with antibiotics but did not help.  Cough keeps her up and bothers her a lot during the day.    No fevers or sputum or leg swelling or shortness of breath (except when coughing fit)   Does not endorse heartburn or nasal congestion   ROS: per HPI  Pertinent PMHx: No medications No history of asthma  History of Hypertension but has not been taking Benicar for months  Exam:  Respiratory: very frequent dry sounding cough.  Normal speech   Assessment/Plan:  Cough Prolonged possibly post bronchitis although the ER note from July says she had been having for months before.  She does not have insurance so will first do a trial of Albuterol.  If not marked improved will need a Cxray and in person exam to check her weight and lung exam  Will get insurance in Jan 2021    Time spent during visit with patient: 20 minutes

## 2020-12-18 ENCOUNTER — Other Ambulatory Visit: Payer: Self-pay | Admitting: Family Medicine

## 2020-12-18 DIAGNOSIS — Z1231 Encounter for screening mammogram for malignant neoplasm of breast: Secondary | ICD-10-CM

## 2021-01-08 ENCOUNTER — Other Ambulatory Visit: Payer: Self-pay

## 2021-01-08 ENCOUNTER — Ambulatory Visit (INDEPENDENT_AMBULATORY_CARE_PROVIDER_SITE_OTHER): Payer: No Typology Code available for payment source | Admitting: Family Medicine

## 2021-01-08 ENCOUNTER — Other Ambulatory Visit (HOSPITAL_COMMUNITY)
Admission: RE | Admit: 2021-01-08 | Discharge: 2021-01-08 | Disposition: A | Discharge status: Home / Self Care | Payer: BLUE CROSS/BLUE SHIELD | Source: Ambulatory Visit | Attending: Family Medicine | Admitting: Family Medicine

## 2021-01-08 ENCOUNTER — Encounter: Payer: Self-pay | Admitting: Family Medicine

## 2021-01-08 VITALS — BP 152/82 | HR 65 | Ht 62.0 in | Wt 197.8 lb

## 2021-01-08 DIAGNOSIS — Z113 Encounter for screening for infections with a predominantly sexual mode of transmission: Secondary | ICD-10-CM | POA: Diagnosis not present

## 2021-01-08 DIAGNOSIS — I10 Essential (primary) hypertension: Secondary | ICD-10-CM

## 2021-01-08 DIAGNOSIS — K921 Melena: Secondary | ICD-10-CM

## 2021-01-08 DIAGNOSIS — Z124 Encounter for screening for malignant neoplasm of cervix: Secondary | ICD-10-CM | POA: Diagnosis not present

## 2021-01-08 DIAGNOSIS — Z0000 Encounter for general adult medical examination without abnormal findings: Secondary | ICD-10-CM

## 2021-01-08 DIAGNOSIS — N926 Irregular menstruation, unspecified: Secondary | ICD-10-CM | POA: Diagnosis not present

## 2021-01-08 MED ORDER — FLUTICASONE PROPIONATE 50 MCG/ACT NA SUSP: 2.0000 | Freq: Every day | NASAL | 12 refills | Status: AC

## 2021-01-08 MED ORDER — OLMESARTAN MEDOXOMIL 20 MG PO TABS
20.0000 mg | ORAL_TABLET | Freq: Every day | ORAL | 3 refills | Status: DC
Start: 2021-01-08 — End: 2021-02-12

## 2021-01-08 NOTE — Assessment & Plan Note (Signed)
Reviewed her blood pressure readings over the last couple of years and she definitely has hypertension.  She did not take medicine for more than about a month.  We will try her again on a different medicine without diuretic.  Labs today.  Follow-up 1 month.  Long discussion about absolute need to control blood pressure and gave her a handout on it.

## 2021-01-08 NOTE — Progress Notes (Signed)
    CHIEF COMPLAINT / HPI: Here for physical, follow-up diagnosis of hypertension, discussion of change in her menstrual cycle. 1.  Has gave her blood pressure pill that caused muscle cramping.  When I changed her blood pressure pill she never picked up the second 1 so she has been of any type of antihypertensive medicine since last visit.  She has had no symptoms of hypertension specifically no headaches, no lower extremity edema, shortness of breath or exercise tolerance change. 2.  Menstrual cycles have been regular up until about 5 months ago when she started occasionally missing her having longer spaces between cycles.  She does want to have STI testing today.  History of abnormal Pap. #3.  Has had several episodes of blood in her stool.  Not sure if this is related to hemorrhoids or something else.  Has never had screening colonoscopy.  PERTINENT  PMH / PSH: I have reviewed the patient's medications, allergies, past medical and surgical history, smoking status and updated in the EMR as appropriate.   OBJECTIVE:  BP (!) 152/82   Pulse 65   Ht 5\' 2"  (1.575 m)   Wt 197 lb 12.8 oz (89.7 kg)   LMP 12/23/2020 (Exact Date)   SpO2 97%   BMI 36.18 kg/m  Vital signs reviewed. GENERAL: Well-developed, well-nourished, no acute distress. CARDIOVASCULAR: Regular rate and rhythm no murmur gallop or rub LUNGS: Clear to auscultation bilaterally, no rales or wheeze. ABDOMEN: Soft positive bowel sounds NEURO: No gross focal neurological deficits. MSK: Movement of extremity x 4. HEENT: Pupils equal round reactive to light.  Conjunctive is nonicteric.  Extraocular muscles are intact. Neck: No lymphadenopathy no thyromegaly.  Negative for carotid bruits.  Supple. GU: Externally normal female genitalia.  Bimanual exam is limited somewhat by habitus but no adnexal masses are noted.  Cervix appears normal on speculum exam.  ASSESSMENT / PLAN:   Well adult exam Pap smear today.  Set her up for  colonoscopy.  Given her episodes of bright red blood in stool, it would not technically be a screening colonoscopy.  Essential hypertension, benign Reviewed her blood pressure readings over the last couple of years and she definitely has hypertension.  She did not take medicine for more than about a month.  We will try her again on a different medicine without diuretic.  Labs today.  Follow-up 1 month.  Long discussion about absolute need to control blood pressure and gave her a handout on it.   Dorcas Mcmurray MD

## 2021-01-08 NOTE — Assessment & Plan Note (Signed)
Pap smear today.  Set her up for colonoscopy.  Given her episodes of bright red blood in stool, it would not technically be a screening colonoscopy.

## 2021-01-08 NOTE — Patient Instructions (Addendum)
Please start the new blood pressure medicine/ If you have ANY issues with it, please let me know ASAP by phone message or my chart. Let me see you  in 4-5 weeks to recheck your blood pressure. Great to see you!  Here is some info on high blood pressure Hypertension, Adult Hypertension is another name for high blood pressure. High blood pressure forces your heart to work harder to pump blood. This can cause problems overtime. There are two numbers in a blood pressure reading. There is a top number (systolic) over a bottom number (diastolic). It is best to have a blood pressure that is below 120/80. Healthy choicescan help lower your blood pressure, or you may need medicine to help lower it. What are the causes? The cause of this condition is not known. Some conditions may be related tohigh blood pressure. ZJQBH are the signs or symptoms? High blood pressure may not cause symptoms. Very high blood pressure (hypertensive crisis) may cause: Headache. Feelings of worry or nervousness (anxiety). Shortness of breath. Nosebleed. A feeling of being sick to your stomach (nausea).  Eating and drinking  If told, follow the DASH eating plan. To follow this plan: Fill one half of your plate at each meal with fruits and vegetables. Fill one fourth of your plate at each meal with whole grains. Whole grains include whole-wheat pasta, brown rice, and whole-grain bread. Eat or drink low-fat dairy products, such as skim milk or low-fat yogurt. Fill one fourth of your plate at each meal with low-fat (lean) proteins. Low-fat proteins include fish, chicken without skin, eggs, beans, and tofu. Avoid fatty meat, cured and processed meat, or chicken with skin. Avoid pre-made or processed food. Eat less than 1,500 mg of salt each day. Do not drink alcohol if: Your doctor tells you not to drink. You are pregnant, may be pregnant, or are planning to become pregnant. If you drink alcohol: Limit how much you use  to: 0-1 drink a day for women. 0-2 drinks a day for men. Be aware of how much alcohol is in your drink. In the [place] one drink equals one 12 oz bottle of beer (355 mL), one 5 oz glass of wine (148 mL), or one 1 oz glass of hard liquor (44 mL).  Lifestyle  Work with your doctor to stay at a healthy weight or to lose weight. Ask your doctor what the best weight is for you. Get at least 30 minutes of exercise most days of the week. This may include walking, swimming, or biking. Get at least 30 minutes of exercise that strengthens your muscles (resistance exercise) at least 3 days a week. This may include lifting weights or doing Pilates. Do not use any products that contain nicotine or tobacco, such as cigarettes, e-cigarettes, and chewing tobacco. If you need help quitting, ask your doctor. Check your blood pressure at home as told by your doctor. Keep all follow-up visits as told by your doctor. This is important.  Medicines Do not skip doses of blood pressure medicine. The medicine does not work as well if you skip doses. Skipping doses also puts you at risk for problems.  Contact a doctor if you: Think you are having a reaction to the medicine you are taking. Have headaches that keep coming back (recurring). Feel dizzy. Have swelling in your ankles. Have trouble with your vision.  Summary Hypertension is another name for high blood pressure. High blood pressure forces your heart to work harder to pump blood. For  most people, a normal blood pressure is less than 120/80. Making healthy choices can help lower blood pressure. If your blood pressure does not get lower with healthy choices, you may need to take medicine. This information is not intended to replace advice given to you by your health care provider. Make sure you discuss any questions you have with your healthcare provider. Document Revised: 03/02/2018 Document Reviewed: 03/02/2018 Elsevier Patient Education  [place].

## 2021-01-09 LAB — COMPREHENSIVE METABOLIC PANEL
ALT: 10 IU/L (ref 0–32)
AST: 18 IU/L (ref 0–40)
Albumin/Globulin Ratio: 1.3 (ref 1.2–2.2)
Albumin: 4.2 g/dL (ref 3.8–4.9)
Alkaline Phosphatase: 74 IU/L (ref 44–121)
BUN/Creatinine Ratio: 12 (ref 9–23)
BUN: 9 mg/dL (ref 6–24)
Bilirubin Total: 0.3 mg/dL (ref 0.0–1.2)
CO2: 22 mmol/L (ref 20–29)
Calcium: 9.5 mg/dL (ref 8.7–10.2)
Chloride: 103 mmol/L (ref 96–106)
Creatinine, Ser: 0.78 mg/dL (ref 0.57–1.00)
Globulin, Total: 3.3 g/dL (ref 1.5–4.5)
Glucose: 90 mg/dL (ref 65–99)
Potassium: 4.1 mmol/L (ref 3.5–5.2)
Sodium: 138 mmol/L (ref 134–144)
Total Protein: 7.5 g/dL (ref 6.0–8.5)
eGFR: 91 mL/min/{1.73_m2} (ref >59)

## 2021-01-09 LAB — LDL CHOLESTEROL, DIRECT: LDL Direct: 122 mg/dL — ABNORMAL HIGH (ref 0–99)

## 2021-01-10 ENCOUNTER — Encounter: Payer: Self-pay | Admitting: Family Medicine

## 2021-01-13 LAB — CYTOLOGY - PAP
Adequacy: ABSENT
Chlamydia: NEGATIVE
Comment: NEGATIVE
Comment: NEGATIVE
Comment: NEGATIVE
Comment: NORMAL
Diagnosis: NEGATIVE
High risk HPV: NEGATIVE
Neisseria Gonorrhea: NEGATIVE
Trichomonas: NEGATIVE

## 2021-02-03 ENCOUNTER — Encounter: Payer: Self-pay | Admitting: Gastroenterology

## 2021-02-11 ENCOUNTER — Ambulatory Visit
Admission: RE | Admit: 2021-02-11 | Discharge: 2021-02-11 | Disposition: A | Discharge status: Home / Self Care | Payer: BC Managed Care – PPO | Source: Ambulatory Visit | Attending: Family Medicine | Admitting: Family Medicine

## 2021-02-11 ENCOUNTER — Other Ambulatory Visit: Payer: Self-pay

## 2021-02-11 DIAGNOSIS — Z1231 Encounter for screening mammogram for malignant neoplasm of breast: Secondary | ICD-10-CM

## 2021-02-12 ENCOUNTER — Encounter: Payer: Self-pay | Admitting: Family Medicine

## 2021-02-12 ENCOUNTER — Ambulatory Visit: Payer: BC Managed Care – PPO | Admitting: Family Medicine

## 2021-02-12 VITALS — BP 124/76 | HR 67 | Ht 62.0 in | Wt 195.8 lb

## 2021-02-12 DIAGNOSIS — I10 Essential (primary) hypertension: Secondary | ICD-10-CM | POA: Diagnosis not present

## 2021-02-12 MED ORDER — AMLODIPINE BESYLATE 2.5 MG PO TABS: 2.5000 mg | ORAL_TABLET | Freq: Every day | ORAL | 1 refills | Status: DC

## 2021-02-12 NOTE — Progress Notes (Signed)
    CHIEF COMPLAINT / HPI: #1 return to clinic for follow-up starting blood pressure medicine.  Unfortunately she did develop a slight cough with this medicine.  Otherwise she is felt fine.  She is happy that her blood pressure is much improved. 2.  Has some questions about her recent lab work and Pap smear.   PERTINENT  PMH / PSH: I have reviewed the patient's medications, allergies, past medical and surgical history, smoking status and updated in the EMR as appropriate.   OBJECTIVE:  BP 124/76   Pulse 67   Ht '5\' 2"'$  (1.575 m)   Wt 195 lb 12.8 oz (88.8 kg)   LMP 01/30/2021 (Approximate)   SpO2 99%   BMI 35.81 kg/m  GENERAL: Well-developed female no acute distress CV: Regular rate and rhythm  ASSESSMENT / PLAN: #1.  Questions about lab results: We had done her Pap smear but unfortunately did not get a good sampling of the transformation zone.  The cells that we did get on the test were normal but I would prefer to get a repeat Pap smear in 1 year and we have discussed this.  Essential hypertension, benign She gives a pretty good history of cough since she started the ARB.  She also had some problems with ACE.  I think we will just change totally put her on amlodipine 2.5.  See her back in 3 to 4 weeks.   Dorcas Mcmurray MD

## 2021-02-12 NOTE — Patient Instructions (Signed)
STOP the olmesartan and START the amlodipine. Let me see you in about 4 weeks.

## 2021-02-12 NOTE — Assessment & Plan Note (Signed)
She gives a pretty good history of cough since she started the ARB.  She also had some problems with ACE.  I think we will just change totally put her on amlodipine 2.5.  See her back in 3 to 4 weeks.

## 2021-03-12 ENCOUNTER — Encounter: Payer: Self-pay | Admitting: Family Medicine

## 2021-03-12 ENCOUNTER — Ambulatory Visit: Payer: BC Managed Care – PPO | Admitting: Family Medicine

## 2021-03-12 ENCOUNTER — Encounter: Payer: Self-pay | Admitting: Gastroenterology

## 2021-03-12 ENCOUNTER — Other Ambulatory Visit: Payer: Self-pay

## 2021-03-12 ENCOUNTER — Ambulatory Visit: Payer: BC Managed Care – PPO | Admitting: Gastroenterology

## 2021-03-12 VITALS — BP 122/70 | HR 56 | Ht 62.0 in | Wt 191.0 lb

## 2021-03-12 DIAGNOSIS — K625 Hemorrhage of anus and rectum: Secondary | ICD-10-CM | POA: Diagnosis not present

## 2021-03-12 DIAGNOSIS — Z1211 Encounter for screening for malignant neoplasm of colon: Secondary | ICD-10-CM

## 2021-03-12 DIAGNOSIS — Z1212 Encounter for screening for malignant neoplasm of rectum: Secondary | ICD-10-CM

## 2021-03-12 DIAGNOSIS — I10 Essential (primary) hypertension: Secondary | ICD-10-CM | POA: Diagnosis not present

## 2021-03-12 MED ORDER — PLENVU 140 G PO SOLR: ORAL | 0 refills | Status: DC

## 2021-03-12 MED ORDER — AMLODIPINE BESYLATE 2.5 MG PO TABS: 2.5000 mg | ORAL_TABLET | Freq: Every day | ORAL | 3 refills | Status: DC

## 2021-03-12 NOTE — Patient Instructions (Signed)
If you are age 52 or older, your body mass index should be between 23-30. Your Body mass index is 34.93 kg/m. If this is out of the aforementioned range listed, please consider follow up with your Primary Care Provider.  If you are age 18 or younger, your body mass index should be between 19-25. Your Body mass index is 34.93 kg/m. If this is out of the aformentioned range listed, please consider follow up with your Primary Care Provider.   You have been scheduled for a colonoscopy. Please follow written instructions given to you at your visit today.  Please pick up your prep supplies at the pharmacy within the next 1-3 days. If you use inhalers (even only as needed), please bring them with you on the day of your procedure.   The [facility] providers would like to encourage you to use [facility] to communicate with providers for non-urgent requests or questions.  Due to long hold times on the telephone, sending your provider a message by [facility] may be a faster and more efficient way to get a response.  Please allow 48 business hours for a response.  Please remember that this is for non-urgent requests.   It was a pleasure to see you today!  Thank you for trusting me with your gastrointestinal care!     Scott E. Candis Schatz, MD

## 2021-03-12 NOTE — Progress Notes (Signed)
HPI : Sara Dorsey is a pleasant 50 year old female referred to [place] by Dr. Dorcas Mcmurray for further evaluation of hematochezia.  She first saw bright red blood per rectum in June of this year.  Since then, she has seen blood infrequently, with the last time being mid July when she saw any blood.  She reports the blood as bright red, seen on the toilet paper and along the edge of her stool.  The blood has been associated with constipation, hard stools and straining with bowel movements.   She typically has regular bowel movements, without diarrhea or constipation.  No problems with abdominal pain.  No upper GI symptoms.  No weight loss.  No family history of colon cancer. She has never had a colonoscopy. She denies any chronic upper GI symptoms such as dysphagia, n/v or heartburn/acid regurgitation.   Past Medical History:  Diagnosis Date   Hypertension      Past Surgical History:  Procedure Laterality Date   TUBAL LIGATION  2000   Family History  Problem Relation Age of Onset   Hypertension Mother    Colon cancer Neg Hx    Esophageal cancer Neg Hx    Rectal cancer Neg Hx    Social History   Tobacco Use   Smoking status: Never   Smokeless tobacco: Never  Vaping Use   Vaping Use: Never used  Substance Use Topics   Alcohol use: Never   Drug use: Never   Current Outpatient Medications  Medication Sig Dispense Refill   amLODipine (NORVASC) 2.5 MG tablet Take 1 tablet (2.5 mg total) by mouth at bedtime. 90 tablet 3   fluticasone (FLONASE) 50 MCG/ACT nasal spray Place 2 sprays into both nostrils daily. 16 g 12   No current facility-administered medications for this visit.   Allergies  Allergen Reactions   Lisinopril Nausea And Vomiting, Other (See Comments) and Cough    Other: abdominal cramping     Review of Systems: All systems reviewed and negative except where noted in HPI.    MM 3D SCREEN BREAST BILATERAL  Result Date: 02/13/2021 CLINICAL DATA:  Screening.  EXAM: DIGITAL SCREENING BILATERAL MAMMOGRAM WITH TOMOSYNTHESIS AND CAD TECHNIQUE: Bilateral screening digital craniocaudal and mediolateral oblique mammograms were obtained. Bilateral screening digital breast tomosynthesis was performed. The images were evaluated with computer-aided detection. COMPARISON:  Previous exam(s). ACR Breast Density Category b: There are scattered areas of fibroglandular density. FINDINGS: There are no findings suspicious for malignancy. IMPRESSION: No mammographic evidence of malignancy. A result letter of this screening mammogram will be mailed directly to the patient. RECOMMENDATION: Screening mammogram in one year. (Code:SM-B-01Y) BI-RADS CATEGORY  1: Negative. Electronically Signed   By: Abelardo Diesel M.D.   On: 02/13/2021 13:08    Physical Exam: BP 122/70   Pulse (!) 56   Ht '5\' 2"'$  (1.575 m)   Wt 191 lb (86.6 kg)   LMP 02/16/2021   BMI 34.93 kg/m  Constitutional: Pleasant,well-developed, African American female in no acute distress. HEENT: Normocephalic and atraumatic. Conjunctivae are normal. No scleral icterus. Cardiovascular: Normal rate, regular rhythm.  Pulmonary/chest: Effort normal and breath sounds normal. No wheezing, rales or rhonchi. Abdominal: Soft, nondistended, nontender. Bowel sounds active throughout. There are no masses palpable. No hepatomegaly. Extremities: no edema Neurological: Alert and oriented to person place and time. Skin: Skin is warm and dry. No rashes noted. Psychiatric: Normal mood and affect. Behavior is normal.  CBC    Component Value Date/Time   WBC 14.0 (  H) 10/22/2009 1542   RBC 4.42 10/22/2009 1542   HGB 13.9 10/22/2009 1542   HCT 40.9 10/22/2009 1542   PLT 282 10/22/2009 1542   MCV 92.5 10/22/2009 1542   MCHC 34.1 10/22/2009 1542   RDW 12.6 10/22/2009 1542    CMP     Component Value Date/Time   NA 138 01/08/2021 1059   K 4.1 01/08/2021 1059   CL 103 01/08/2021 1059   CO2 22 01/08/2021 1059   GLUCOSE 90  01/08/2021 1059   GLUCOSE 90 01/15/2016 0854   BUN 9 01/08/2021 1059   CREATININE 0.78 01/08/2021 1059   CREATININE 1.01 01/15/2016 0854   CALCIUM 9.5 01/08/2021 1059   PROT 7.5 01/08/2021 1059   ALBUMIN 4.2 01/08/2021 1059   AST 18 01/08/2021 1059   ALT 10 01/08/2021 1059   ALKPHOS 74 01/08/2021 1059   BILITOT 0.3 01/08/2021 1059   GFRNONAA 66 01/15/2016 0854   GFRAA 77 01/15/2016 0854     ASSESSMENT AND PLAN: 50 year old female referred for new onset bright red blood per rectum in the setting of hard stools and constipation, with no recent episodes in the past 4-5 weeks since her bowel movements have normalized.  This history is very consistent with bleeding from internal hemorrhoids, but the patient has never had a colonoscopy, so she will need one to exclude other potential etiologies such as mass lesion or proctitis.  Hematochezia - Likely hemorrhoids, colonoscopy to exclude other etiologies  The details, risks (including bleeding, perforation, infection, missed lesions, medication reactions and possible hospitalization or surgery if complications occur), benefits, and alternatives to colonoscopy with possible biopsy and possible polypectomy were discussed with the patient and she consents to proceed.    Raequan Vanschaick E. Candis Schatz, [facility]    Dickie La, MD

## 2021-03-13 ENCOUNTER — Encounter: Payer: Self-pay | Admitting: Family Medicine

## 2021-03-13 NOTE — Assessment & Plan Note (Signed)
Excellent control.  We had checked labs initially and when I was going to switch her to an ACE inhibitor, I told her we would need follow-up lab work but since we are now going with a calcium channel blocker I do not need to recheck that lab for potassium or creatinine today.I will see her back for follow-up of hypertension at her next well visit unless she has some issues before then.

## 2021-03-13 NOTE — Progress Notes (Signed)
    CHIEF COMPLAINT / HPI: #1 hypertension: She has been taking the medicine that we switched her to at last office visit regularly.  She likes it.  She has had no cough, no lower extremity swelling.  States she has had no side effects whatsoever and her blood pressure seems to be well controlled when she checks it. 2.  Preventive care: She had set up for evaluation for colonoscopy and has some questions.   PERTINENT  PMH / PSH: I have reviewed the patient's medications, allergies, past medical and surgical history, smoking status and updated in the EMR as appropriate.   OBJECTIVE:  BP 126/78   Pulse 67   Ht '5\' 2"'$  (1.575 m)   Wt 191 lb 9.6 oz (86.9 kg)   LMP 02/16/2021   SpO2 99%   BMI 35.04 kg/m  GENERAL: Well-developed female no acute distress CV: Regular rate and rhythm  ASSESSMENT / PLAN: Regarding healthcare maintenance: We discussed colonoscopy.  She has an appointment to be seen for evaluation today.  I do think it is important she get this done.  Essential hypertension, benign Excellent control.  We had checked labs initially and when I was going to switch her to an ACE inhibitor, I told her we would need follow-up lab work but since we are now going with a calcium channel blocker I do not need to recheck that lab for potassium or creatinine today.I will see her back for follow-up of hypertension at her next well visit unless she has some issues before then.   Dorcas Mcmurray MD

## 2021-04-01 ENCOUNTER — Encounter: Payer: BC Managed Care – PPO | Admitting: Gastroenterology

## 2021-10-01 ENCOUNTER — Ambulatory Visit: Payer: BC Managed Care – PPO | Admitting: Family Medicine

## 2021-10-15 ENCOUNTER — Ambulatory Visit: Payer: BC Managed Care – PPO | Admitting: Family Medicine

## 2021-10-21 ENCOUNTER — Telehealth: Payer: Self-pay | Admitting: Family Medicine

## 2021-10-23 ENCOUNTER — Ambulatory Visit (INDEPENDENT_AMBULATORY_CARE_PROVIDER_SITE_OTHER): Payer: BC Managed Care – PPO | Admitting: Family Medicine

## 2021-10-23 VITALS — BP 136/72 | HR 63 | Wt 183.0 lb

## 2021-10-23 DIAGNOSIS — K649 Unspecified hemorrhoids: Secondary | ICD-10-CM

## 2021-10-23 DIAGNOSIS — K625 Hemorrhage of anus and rectum: Secondary | ICD-10-CM | POA: Insufficient documentation

## 2021-10-23 DIAGNOSIS — N951 Menopausal and female climacteric states: Secondary | ICD-10-CM | POA: Diagnosis not present

## 2021-10-23 MED ORDER — HYDROCORTISONE ACETATE 25 MG RE SUPP: 25.0000 mg | Freq: Two times a day (BID) | RECTAL | 1 refills | Status: DC

## 2021-10-23 NOTE — Assessment & Plan Note (Signed)
Went 4 months without a menstrual cycle and then had an irregularly long cycle.  Has had some intermittent hot flashes.  We discussed.  She will keep a menstrual diary of her bleeding.  She does not wish to pursue anything for hot flashes at this time. ?

## 2021-10-23 NOTE — Assessment & Plan Note (Signed)
Several months of rectal bleeding, typically bright red blood with bowel movement.  Unfortunately she did not complete her screening colonoscopy in September because she got a new job and started that and had to cancel the procedure.  I have sent a second referral in for what will now be a diagnostic colonoscopy.  She is to call them Monday and see if she can get that arranged.  Have also sent in referral for evaluation by general surgery as it sounds like this is most likely hemorrhoidal bleeding.  I do think both things need to be accomplished.  I gave her some suppositories.  I will follow her up in 4 to 5 weeks. ?

## 2021-10-23 NOTE — Patient Instructions (Signed)
Next week call [facility] and reschedule your colonoscopy. I sent in a new referral ? ?I also sent a referral to [facility] should call you in next 7 days. ? ?I sent in the suppositories. ? ?Let me see you in 4-5 weeks. ?

## 2021-10-23 NOTE — Progress Notes (Signed)
? ? ?  CHIEF COMPLAINT / HPI: ?#1.  Rectal bleeding for several months.  Seems to be getting worse.  Happens with bowel movement.  Usually bright red.  Sometimes its several teaspoons sometimes just a small amount mixed with stool.  She also has some cramping and fecal urgency at times.  Has noticed that her bowel seems more bloated recently.  No weight loss.  Had scheduled for screening colonoscopy back in September but then got a new job and had to cancel it so she has not had that done yet.  Not having a lot of pain with the episodes but some discomfort. ?2.  Has not had a menstrual cycle for 4 months.  Then she started having menstrual cycle that lasted longer than usual.  Previously she had been regular to this.  Is also intermittently had a few hot flashes. ? ? ?PERTINENT  PMH / PSH: I have reviewed the patient?s medications, allergies, past medical and surgical history, smoking status and updated in the EMR as appropriate. ?Did not complete her screening colonoscopy ? ?OBJECTIVE: ? BP 136/72   Pulse 63   Wt 183 lb (83 kg)   LMP 06/05/2021   SpO2 99%   BMI 33.47 kg/m?  ?GENERAL: Well-developed female no acute distress ? ?ASSESSMENT / PLAN: ? ? ?Rectal bleeding ?Several months of rectal bleeding, typically bright red blood with bowel movement.  Unfortunately she did not complete her screening colonoscopy in September because she got a new job and started that and had to cancel the procedure.  I have sent a second referral in for what will now be a diagnostic colonoscopy.  She is to call them Monday and see if she can get that arranged.  Have also sent in referral for evaluation by general surgery as it sounds like this is most likely hemorrhoidal bleeding.  I do think both things need to be accomplished.  I gave her some suppositories.  I will follow her up in 4 to 5 weeks. ? ?Perimenopausal symptoms ?Went 4 months without a menstrual cycle and then had an irregularly long cycle.  Has had some intermittent  hot flashes.  We discussed.  She will keep a menstrual diary of her bleeding.  She does not wish to pursue anything for hot flashes at this time. ?  ?Dorcas Mcmurray MD ?

## 2021-10-29 ENCOUNTER — Ambulatory Visit: Payer: BC Managed Care – PPO | Admitting: Family Medicine

## 2021-11-26 ENCOUNTER — Ambulatory Visit: Payer: BC Managed Care – PPO | Admitting: Family Medicine

## 2021-12-23 DIAGNOSIS — K641 Second degree hemorrhoids: Secondary | ICD-10-CM | POA: Diagnosis not present

## 2022-01-08 ENCOUNTER — Ambulatory Visit: Payer: BC Managed Care – PPO | Admitting: Gastroenterology

## 2022-02-09 DIAGNOSIS — K641 Second degree hemorrhoids: Secondary | ICD-10-CM | POA: Diagnosis not present

## 2022-03-12 ENCOUNTER — Other Ambulatory Visit: Payer: Self-pay

## 2022-03-12 MED ORDER — AMLODIPINE BESYLATE 2.5 MG PO TABS: 2.5000 mg | ORAL_TABLET | Freq: Every day | ORAL | 3 refills | Status: DC

## 2022-04-01 ENCOUNTER — Other Ambulatory Visit: Payer: Self-pay | Admitting: Family Medicine

## 2022-04-01 DIAGNOSIS — Z1231 Encounter for screening mammogram for malignant neoplasm of breast: Secondary | ICD-10-CM

## 2022-04-08 ENCOUNTER — Ambulatory Visit
Admission: RE | Admit: 2022-04-08 | Discharge: 2022-04-08 | Disposition: A | Discharge status: Home / Self Care | Payer: BC Managed Care – PPO | Source: Ambulatory Visit | Attending: Family Medicine | Admitting: Family Medicine

## 2022-04-08 DIAGNOSIS — Z1231 Encounter for screening mammogram for malignant neoplasm of breast: Secondary | ICD-10-CM

## 2022-04-21 ENCOUNTER — Ambulatory Visit: Payer: BC Managed Care – PPO | Admitting: Gastroenterology

## 2022-04-21 ENCOUNTER — Encounter: Payer: Self-pay | Admitting: Gastroenterology

## 2022-04-21 VITALS — BP 100/80 | HR 71

## 2022-04-21 DIAGNOSIS — K921 Melena: Secondary | ICD-10-CM

## 2022-04-21 DIAGNOSIS — R1030 Lower abdominal pain, unspecified: Secondary | ICD-10-CM | POA: Diagnosis not present

## 2022-04-21 DIAGNOSIS — Z1211 Encounter for screening for malignant neoplasm of colon: Secondary | ICD-10-CM

## 2022-04-21 DIAGNOSIS — Z1212 Encounter for screening for malignant neoplasm of rectum: Secondary | ICD-10-CM | POA: Diagnosis not present

## 2022-04-21 MED ORDER — NA SULFATE-K SULFATE-MG SULF 17.5-3.13-1.6 GM/177ML PO SOLN: 1.0000 | Freq: Once | ORAL | 0 refills | Status: AC

## 2022-04-21 NOTE — Patient Instructions (Addendum)
If you are age 53 or older, your body mass index should be between 23-30. Your There is no height or weight on file to calculate BMI. If this is out of the aforementioned range listed, please consider follow up with your Primary Care Provider.  If you are age 89 or younger, your body mass index should be between 19-25. Your There is no height or weight on file to calculate BMI. If this is out of the aformentioned range listed, please consider follow up with your Primary Care Provider.   You have been scheduled for a colonoscopy. Please follow written instructions given to you at your visit today.  Please pick up your prep supplies at the pharmacy within the next 1-3 days. If you use inhalers (even only as needed), please bring them with you on the day of your procedure.  The [facility] providers would like to encourage you to use [facility] to communicate with providers for non-urgent requests or questions.  Due to long hold times on the telephone, sending your provider a message by [facility] may be a faster and more efficient way to get a response.  Please allow 48 business hours for a response.  Please remember that this is for non-urgent requests.   It was a pleasure to see you today!  Thank you for trusting me with your gastrointestinal care!    Scott E.Candis Schatz, MD

## 2022-04-21 NOTE — Progress Notes (Signed)
HPI : Sara Dorsey is a very pleasant 51 year old female with history of hypertension who I initially saw in September 2022 with symptoms of rectal bleeding, presumed to be secondary to hemorrhoids.  She was scheduled for a colonoscopy at that time, but she had to cancel due to getting a new job and being unable to get off work.  Since then, she continued to have bothersome bright red blood per rectum and was referred to colorectal surgery, where Dr. Marcello Moores banded to hemorrhoids.  She experienced improvement in her hemorrhoidal bleeding, but still does have occasional bright red blood per rectum.  She denies any symptoms of rectal/perianal pain or prolapse symptoms.  She does complain of an odd pelvic discomfort which is elicited with certain movements and activities (lifting, pulling, pushing).  This sensation is appreciated deep in her pelvis and feels like a menstrual cramp but only last for a moment.  This has been going on for several months.  She thinks that the symptoms were present before the hemorrhoid banding. She has regular bowel movements, usually 2 to 3/day.  Her stools are typically formed and soft.  She denies any difficulty with straining or small hard stools.  No problems with fecal incontinence.  No urinary symptoms.  Past Medical History:  Diagnosis Date   Hypertension      Past Surgical History:  Procedure Laterality Date   TUBAL LIGATION  2000   Family History  Problem Relation Age of Onset   Hypertension Mother    Colon cancer Neg Hx    Esophageal cancer Neg Hx    Rectal cancer Neg Hx    Breast cancer Neg Hx    Social History   Tobacco Use   Smoking status: Never    Passive exposure: Never   Smokeless tobacco: Never  Vaping Use   Vaping Use: Never used  Substance Use Topics   Alcohol use: Never   Drug use: Never   Current Outpatient Medications  Medication Sig Dispense Refill   amLODipine (NORVASC) 2.5 MG tablet Take 1 tablet (2.5 mg total) by  mouth at bedtime. 90 tablet 3   fluticasone (FLONASE) 50 MCG/ACT nasal spray Place 2 sprays into both nostrils daily. 16 g 12   hydrocortisone (ANUSOL-HC) 25 MG suppository Place 1 suppository (25 mg total) rectally every 12 (twelve) hours. (Patient not taking: Reported on 04/21/2022) 12 suppository 1   PEG-KCl-NaCl-NaSulf-Na Asc-C (PLENVU) 140 g SOLR Use as directed. Manufacturer's coupon Universal coupon code:BIN: P2366821; GROUP: [id number]; PCN: [id number]; ID: [id number]; PAY NO MORE $50; NO prior authorization (Patient not taking: Reported on 04/21/2022) 1 each 0   No current facility-administered medications for this visit.   Allergies  Allergen Reactions   Lisinopril Nausea And Vomiting, Other (See Comments) and Cough    Other: abdominal cramping     Review of Systems: All systems reviewed and negative except where noted in HPI.    MM 3D SCREEN BREAST BILATERAL  Result Date: 04/09/2022 CLINICAL DATA:  Screening. EXAM: DIGITAL SCREENING BILATERAL MAMMOGRAM WITH TOMOSYNTHESIS AND CAD TECHNIQUE: Bilateral screening digital craniocaudal and mediolateral oblique mammograms were obtained. Bilateral screening digital breast tomosynthesis was performed. The images were evaluated with computer-aided detection. COMPARISON:  Previous exam(s). ACR Breast Density Category b: There are scattered areas of fibroglandular density. FINDINGS: There are no findings suspicious for malignancy. IMPRESSION: No mammographic evidence of malignancy. A result letter of this screening mammogram will be mailed directly to the patient. RECOMMENDATION: Screening mammogram in one year. (Code:SM-B-01Y) BI-RADS CATEGORY  1: Negative. Electronically Signed   By: Margarette Canada M.D.   On: 04/09/2022 13:32    Physical Exam: BP 100/80   Pulse 71  Constitutional: Pleasant,well-developed, African-American female in no acute distress. HEENT: Normocephalic and atraumatic. Conjunctivae are normal. No scleral icterus. Cardiovascular:  Normal rate, regular rhythm.  Pulmonary/chest: Effort normal and breath sounds normal. No wheezing, rales or rhonchi. Abdominal: Soft, nondistended, nontender. Bowel sounds active throughout. There are no masses palpable. No hepatomegaly.  No hernia appreciated.  Unable to reproduce the discomfort described by the patient on exam Extremities: no edema Rectal:  Deferred until time of colonoscopy Neurological: Alert and oriented to person place and time. Skin: Skin is warm and dry. No rashes noted. Psychiatric: Normal mood and affect. Behavior is normal.  CBC    Component Value Date/Time   WBC 14.0 (H) 10/22/2009 1542   RBC 4.42 10/22/2009 1542   HGB 13.9 10/22/2009 1542   HCT 40.9 10/22/2009 1542   PLT 282 10/22/2009 1542   MCV 92.5 10/22/2009 1542   MCHC 34.1 10/22/2009 1542   RDW 12.6 10/22/2009 1542    CMP     Component Value Date/Time   NA 138 01/08/2021 1059   K 4.1 01/08/2021 1059   CL 103 01/08/2021 1059   CO2 22 01/08/2021 1059   GLUCOSE 90 01/08/2021 1059   GLUCOSE 90 01/15/2016 0854   BUN 9 01/08/2021 1059   CREATININE 0.78 01/08/2021 1059   CREATININE 1.01 01/15/2016 0854   CALCIUM 9.5 01/08/2021 1059   PROT 7.5 01/08/2021 1059   ALBUMIN 4.2 01/08/2021 1059   AST 18 01/08/2021 1059   ALT 10 01/08/2021 1059   ALKPHOS 74 01/08/2021 1059   BILITOT 0.3 01/08/2021 1059   GFRNONAA 66 01/15/2016 0854   GFRAA 77 01/15/2016 0854     ASSESSMENT AND PLAN: 51 year old female with rectal bleeding, presumed to be hemorrhoidal, much improved after banding x2 by Dr. Leighton Ruff.  She has yet to have a colonoscopy.  We will reschedule her for her initial average risk screening colonoscopy.  The patient has a lower abdominal discomfort that is associated with certain physical activities.  No hernia was appreciated on exam.  If patient still having this discomfort at the time of her colonoscopy, I think it may be reasonable to order a CT scan to further evaluate for a  hernia.  Colon cancer screening - Colonoscopy  Hematochezia, hemorrhoidal - Colonoscopy will exclude any other etiologies for hematochezia  Abdominal pain, lower, associated with physical activity - If persistent, will order CT scan to evaluate for hernia  The details, risks (including bleeding, perforation, infection, missed lesions, medication reactions and possible hospitalization or surgery if complications occur), benefits, and alternatives to colonoscopy with possible biopsy and possible polypectomy were discussed with the patient and she consents to proceed.   Vega Withrow E. Candis Schatz, MD [facility]  CC:  Dickie La, MD

## 2022-05-18 ENCOUNTER — Encounter: Payer: Self-pay | Admitting: Gastroenterology

## 2022-05-25 ENCOUNTER — Encounter: Payer: Self-pay | Admitting: Gastroenterology

## 2022-05-25 ENCOUNTER — Other Ambulatory Visit: Payer: Self-pay | Admitting: *Deleted

## 2022-05-25 ENCOUNTER — Other Ambulatory Visit (INDEPENDENT_AMBULATORY_CARE_PROVIDER_SITE_OTHER): Payer: BC Managed Care – PPO

## 2022-05-25 ENCOUNTER — Ambulatory Visit (AMBULATORY_SURGERY_CENTER): Payer: BC Managed Care – PPO | Admitting: Gastroenterology

## 2022-05-25 VITALS — BP 140/82 | HR 55 | Temp 97.7°F | Resp 11 | Ht 62.0 in | Wt 178.0 lb

## 2022-05-25 DIAGNOSIS — D124 Benign neoplasm of descending colon: Secondary | ICD-10-CM

## 2022-05-25 DIAGNOSIS — K921 Melena: Secondary | ICD-10-CM

## 2022-05-25 DIAGNOSIS — R1030 Lower abdominal pain, unspecified: Secondary | ICD-10-CM

## 2022-05-25 DIAGNOSIS — C19 Malignant neoplasm of rectosigmoid junction: Secondary | ICD-10-CM

## 2022-05-25 DIAGNOSIS — K6389 Other specified diseases of intestine: Secondary | ICD-10-CM

## 2022-05-25 DIAGNOSIS — D122 Benign neoplasm of ascending colon: Secondary | ICD-10-CM | POA: Diagnosis not present

## 2022-05-25 DIAGNOSIS — Z1211 Encounter for screening for malignant neoplasm of colon: Secondary | ICD-10-CM | POA: Diagnosis not present

## 2022-05-25 LAB — CBC WITH DIFFERENTIAL/PLATELET
Basophils Absolute: 0.1 10*3/uL (ref 0.0–0.1)
Basophils Relative: 1.2 % (ref 0.0–3.0)
Eosinophils Absolute: 0.6 10*3/uL (ref 0.0–0.7)
Eosinophils Relative: 6.7 % — ABNORMAL HIGH (ref 0.0–5.0)
HCT: 36.6 % (ref 36.0–46.0)
Hemoglobin: 11.9 g/dL — ABNORMAL LOW (ref 12.0–15.0)
Lymphocytes Relative: 24.7 % (ref 12.0–46.0)
Lymphs Abs: 2.1 10*3/uL (ref 0.7–4.0)
MCHC: 32.5 g/dL (ref 30.0–36.0)
MCV: 86.1 fl (ref 78.0–100.0)
Monocytes Absolute: 0.4 10*3/uL (ref 0.1–1.0)
Monocytes Relative: 4.8 % (ref 3.0–12.0)
Neutro Abs: 5.4 10*3/uL (ref 1.4–7.7)
Neutrophils Relative %: 62.6 % (ref 43.0–77.0)
Platelets: 390 10*3/uL (ref 150.0–400.0)
RBC: 4.25 Mil/uL (ref 3.87–5.11)
RDW: 13.8 % (ref 11.5–15.5)
WBC: 8.6 10*3/uL (ref 4.0–10.5)

## 2022-05-25 MED ORDER — SODIUM CHLORIDE 0.9 % IV SOLN: 500.0000 mL | Freq: Once | INTRAVENOUS | Status: DC

## 2022-05-25 NOTE — Progress Notes (Signed)
Report to PACU, RN, vss, BBS= Clear.  

## 2022-05-25 NOTE — Progress Notes (Signed)
[facility] History and Physical   Primary Care Physician:  Dickie La, MD   Reason for Procedure:   Colon cancer screening  Plan:    Screening colonoscopy     HPI: Sara Dorsey is a 51 y.o. female undergoing initial average risk screening colonoscopy.  She has no family history of colon cancer and no chronic GI symptoms other than occasional bleeding from internal hemorrhoids and occasional pelvic pain.    Past Medical History:  Diagnosis Date   Hypertension     Past Surgical History:  Procedure Laterality Date   TUBAL LIGATION  2000    Prior to Admission medications   Medication Sig Start Date End Date Taking? Authorizing Provider  amLODipine (NORVASC) 2.5 MG tablet Take 1 tablet (2.5 mg total) by mouth at bedtime. 03/12/22  Yes Dickie La, MD  fluticasone (FLONASE) 50 MCG/ACT nasal spray Place 2 sprays into both nostrils daily. 01/08/21   Dickie La, MD  hydrocortisone (ANUSOL-HC) 25 MG suppository Place 1 suppository (25 mg total) rectally every 12 (twelve) hours. Patient not taking: Reported on 04/21/2022 10/23/21 10/23/22  Dickie La, MD    Current Outpatient Medications  Medication Sig Dispense Refill   amLODipine (NORVASC) 2.5 MG tablet Take 1 tablet (2.5 mg total) by mouth at bedtime. 90 tablet 3   fluticasone (FLONASE) 50 MCG/ACT nasal spray Place 2 sprays into both nostrils daily. 16 g 12   hydrocortisone (ANUSOL-HC) 25 MG suppository Place 1 suppository (25 mg total) rectally every 12 (twelve) hours. (Patient not taking: Reported on 04/21/2022) 12 suppository 1   Current Facility-Administered Medications  Medication Dose Route Frequency Provider Last Rate Last Admin   0.9 %  sodium chloride infusion  500 mL Intravenous Once Daryel November, MD        Allergies as of 05/25/2022 - Review Complete 05/25/2022  Allergen Reaction Noted   Lisinopril Nausea And Vomiting, Other (See Comments), and Cough 02/12/2017    Family History   Problem Relation Age of Onset   Hypertension Mother    Colon cancer Neg Hx    Esophageal cancer Neg Hx    Rectal cancer Neg Hx    Breast cancer Neg Hx     Social History   Socioeconomic History   Marital status: Married    Spouse name: Not on file   Number of children: 1   Years of education: Not on file   Highest education level: Not on file  Occupational History   Not on file  Tobacco Use   Smoking status: Never    Passive exposure: Never   Smokeless tobacco: Never  Vaping Use   Vaping Use: Never used  Substance and Sexual Activity   Alcohol use: Never   Drug use: Never   Sexual activity: Yes    Partners: Male  Other Topics Concern   Not on file  Social History Narrative   Not on file   Social Determinants of Health   Financial Resource Strain: Not on file  Food Insecurity: Not on file  Transportation Needs: Not on file  Physical Activity: Not on file  Stress: Not on file  Social Connections: Not on file  Intimate Partner Violence: Not on file    Review of Systems:  All other review of systems negative except as mentioned in the HPI.  Physical Exam: Vital signs BP 123/64   Pulse (!) 50   Temp 97.7 F (36.5 C)   Ht '5\' 2"'$  (1.575 m)  Wt 178 lb (80.7 kg)   SpO2 100%   BMI 32.56 kg/m   General:   Alert,  Well-developed, well-nourished, pleasant and cooperative in NAD Airway:  Mallampati 2 Lungs:  Clear throughout to auscultation.   Heart:  Regular rate and rhythm; no murmurs, clicks, rubs,  or gallops. Abdomen:  Soft, nontender and nondistended. Normal bowel sounds.   Neuro/Psych:  Normal mood and affect. A and O x 3   Sara Seiple E. Candis Schatz, MD [facility]

## 2022-05-25 NOTE — Op Note (Signed)
[facility] Patient Name: Sara Dorsey Procedure Date: 05/25/2022 3:11 PM MRN: [id number] Endoscopist: Nicki Reaper E. Candis Schatz , MD, [id number] Age: 51 Referring MD:  Date of Birth: [birth year] Gender: Female Account #: [id number] Procedure:                Colonoscopy Indications:              Screening for colorectal malignant neoplasm, This                            is the patient's first colonoscopy Medicines:                Monitored Anesthesia Care Procedure:                Pre-Anesthesia Assessment:                           - Prior to the procedure, a History and Physical                            was performed, and patient medications and                            allergies were reviewed. The patient's tolerance of                            previous anesthesia was also reviewed. The risks                            and benefits of the procedure and the sedation                            options and risks were discussed with the patient.                            All questions were answered, and informed consent                            was obtained. Prior Anticoagulants: The patient has                            taken no anticoagulant or antiplatelet agents. ASA                            Grade Assessment: II - A patient with mild systemic                            disease. After reviewing the risks and benefits,                            the patient was deemed in satisfactory condition to                            undergo the procedure.  After obtaining informed consent, the colonoscope                            was passed under direct vision. Throughout the                            procedure, the patient's blood pressure, pulse, and                            oxygen saturations were monitored continuously. The                            CF [id number] #[id number] was introduced through the anus                            and  advanced to the the cecum, identified by                            appendiceal orifice and ileocecal valve. The                            colonoscopy was somewhat difficult due to                            significant looping. Successful completion of the                            procedure was aided by using manual pressure. The                            patient tolerated the procedure well. The quality                            of the bowel preparation was adequate. The                            ileocecal valve, appendiceal orifice, and rectum                            were photographed. The bowel preparation used was                            SUPREP via split dose instruction. Scope In: 3:17:53 PM Scope Out: 3:43:46 PM Scope Withdrawal Time: 0 hours 17 minutes 4 seconds  Total Procedure Duration: 0 hours 25 minutes 53 seconds  Findings:                 The perianal and digital rectal examinations were                            normal. Pertinent negatives include normal                            sphincter tone and no palpable rectal lesions.  A polypoid partially obstructing large mass was                            found in the recto-sigmoid colon. The mass was                            non-circumferential. The mass measured                            approximately four cm in length and the distal edge                            was at approximately 17 cm from the anal verge. No                            bleeding was present. This was biopsied with a cold                            forceps for histology. Area was tattooed with an                            injection of 2 mL of Spot (carbon black) about 3-4                            cm proximal to the mass. Estimated blood loss was                            minimal.                           A 14 mm polyp was found in the recto-sigmoid colon.                            The polyp was sessile.                            A 12 mm polyp was found in the ascending colon. The                            polyp was sessile. The polyp was removed with a                            cold snare. Resection and retrieval were complete.                            Estimated blood loss was minimal.                           A 3 mm polyp was found in the descending colon. The                            polyp was sessile. The polyp was removed with a  cold snare. Resection and retrieval were complete.                            Estimated blood loss was minimal.                           The exam was otherwise normal throughout the                            examined colon.                           The retroflexed view of the distal rectum and anal                            verge was normal and showed no anal or rectal                            abnormalities. Complications:            No immediate complications. Estimated Blood Loss:     Estimated blood loss was minimal. 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 Biopsied. Tattooed.                           - One 14 mm polyp at the recto-sigmoid colon. This                            polyp was not removed due to it's very close                            proximity to the mass.                           - One 12 mm polyp in the ascending colon, removed                            with a cold snare. Resected and retrieved.                           - One 3 mm polyp in the descending colon, removed                            with a cold snare. Resected and retrieved.                           - The distal rectum and anal verge are normal on                            retroflexion view. Recommendation:           -  Patient has a contact number available for                            emergencies. The signs and symptoms of potential                             delayed complications were discussed with the                            patient. Return to normal activities tomorrow.                            Written discharge instructions were provided to the                            patient.                           - Resume previous diet.                           - Continue present medications.                           - Await pathology results.                           - Perform a CT scan (computed tomography) of chest                            with contrast, abdomen with contrast and pelvis                            with contrast at appointment to be scheduled.                           - Check CBC, CMP, CEA today.                           - Will place referrals to colorectal surgery and                            medical oncology. Nelsy Madonna E. Candis Schatz, MD 05/25/2022 3:55:07 PM This report has been signed electronically.

## 2022-05-25 NOTE — Patient Instructions (Addendum)
We will take you to the lab today for bloodwork.  The office will call you to arrange the CT Scan.  We will call Dr Marcello Moores for you to arrange removal of the area.     YOU HAD AN ENDOSCOPIC PROCEDURE TODAY AT [facility]:   Refer to the procedure report that was given to you for any specific questions about what was found during the examination.  If the procedure report does not answer your questions, please call your gastroenterologist to clarify.  If you requested that your care partner not be given the details of your procedure findings, then the procedure report has been included in a sealed envelope for you to review at your convenience later.  YOU SHOULD EXPECT: Some feelings of bloating in the abdomen. Passage of more gas than usual.  Walking can help get rid of the air that was put into your GI tract during the procedure and reduce the bloating. If you had a lower endoscopy (such as a colonoscopy or flexible sigmoidoscopy) you may notice spotting of blood in your stool or on the toilet paper. If you underwent a bowel prep for your procedure, you may not have a normal bowel movement for a few days.  Please Note:  You might notice some irritation and congestion in your nose or some drainage.  This is from the oxygen used during your procedure.  There is no need for concern and it should clear up in a day or so.  SYMPTOMS TO REPORT IMMEDIATELY:  Following lower endoscopy (colonoscopy or flexible sigmoidoscopy):  Excessive amounts of blood in the stool  Significant tenderness or worsening of abdominal pains  Swelling of the abdomen that is new, acute  Fever of 100F or higher   For urgent or emergent issues, a gastroenterologist can be reached at any hour by calling [phone]. Do not use MyChart messaging for urgent concerns.    DIET:  We do recommend a small meal at first, but then you may proceed to your regular diet.  Drink plenty of fluids but you should avoid  alcoholic beverages for 24 hours.  ACTIVITY:  You should plan to take it easy for the rest of today and you should NOT DRIVE or use heavy machinery until tomorrow (because of the sedation medicines used during the test).    FOLLOW UP: Our staff will call the number listed on your records the next business day following your procedure.  We will call around 7:15- 8:00 am to check on you and address any questions or concerns that you may have regarding the information given to you following your procedure. If we do not reach you, we will leave a message.     If any biopsies were taken you will be contacted by phone or by letter within the next 1-3 weeks.  Please call us at [phone] if you have not heard about the biopsies in 3 weeks.    SIGNATURES/CONFIDENTIALITY: You and/or your care partner have signed paperwork which will be entered into your electronic medical record.  These signatures attest to the fact that that the information above on your After Visit Summary has been reviewed and is understood.  Full responsibility of the confidentiality of this discharge information lies with you and/or your care-partner.

## 2022-05-25 NOTE — Progress Notes (Signed)
Pt's states no medical or surgical changes since previsit or office visit. 

## 2022-05-26 ENCOUNTER — Telehealth: Payer: Self-pay

## 2022-05-26 ENCOUNTER — Telehealth: Payer: Self-pay | Admitting: Gastroenterology

## 2022-05-26 ENCOUNTER — Other Ambulatory Visit: Payer: Self-pay

## 2022-05-26 DIAGNOSIS — C189 Malignant neoplasm of colon, unspecified: Secondary | ICD-10-CM

## 2022-05-26 LAB — COMPREHENSIVE METABOLIC PANEL
ALT: 9 U/L (ref 0–35)
AST: 14 U/L (ref 0–37)
Albumin: 4.1 g/dL (ref 3.5–5.2)
Alkaline Phosphatase: 71 U/L (ref 39–117)
BUN: 8 mg/dL (ref 6–23)
CO2: 25 mEq/L (ref 19–32)
Calcium: 8.8 mg/dL (ref 8.4–10.5)
Chloride: 106 mEq/L (ref 96–112)
Creatinine, Ser: 0.77 mg/dL (ref 0.40–1.20)
GFR: 87.78 mL/min (ref >60.00)
Glucose, Bld: 84 mg/dL (ref 70–99)
Potassium: 3.7 mEq/L (ref 3.5–5.1)
Sodium: 139 mEq/L (ref 135–145)
Total Bilirubin: 0.4 mg/dL (ref 0.2–1.2)
Total Protein: 8 g/dL (ref 6.0–8.3)

## 2022-05-26 NOTE — Telephone Encounter (Signed)
-----   Message from Daryel November, MD sent at 05/25/2022  4:54 PM EST ----- Regarding: Staging CT for colorectal cancer Vaughan Basta,  Can you please order a CT chest/abd/pelvis with IV/PO contrast for colorectal cancer staging?

## 2022-05-26 NOTE — Telephone Encounter (Signed)
  Follow up Call-     05/25/2022    2:03 PM  Call back number  Post procedure Call Back phone  # [phone]  Permission to leave phone message Yes     Patient questions:  Do you have a fever, pain , or abdominal swelling? No. Pain Score  0 *  Have you tolerated food without any problems? Yes.    Have you been able to return to your normal activities? Yes.    Do you have any questions about your discharge instructions: Diet   No. Medications  No. Follow up visit  No.  Do you have questions or concerns about your Care? Yes.    Actions: * If pain score is 4 or above: No action needed, pain <4.  Patient denies having abdominal pain, but states she is having some lower abdominal cramping, denies any bleeding per rectum. Looking at her procedure report, her colonoscopy was somewhat difficult due to a "loopy" colon. I explained to patient that, often, abdominal pressure needs to be applied during the procedure to aid the doctor in navigating the colon and that can sometimes cause some abdominal soreness, but should not cause pain. Patient denies pain, states she has some soreness and cramping. Advised patient to try warm liquids and walking to continue to aid passage of air and a heating pad may also be helpful. Patient also requests a work note to be out of work until Monday and then be put on light duty until she follows up with the colorectal surgeon. 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 I told patient I would route this message to Dr. Candis Schatz for advisement and that someone from the [facility] will get back with her today. Patient verbalizes understanding of above.

## 2022-05-26 NOTE — Telephone Encounter (Signed)
Patient is calling in regards to a work note she received yesterday for her procedure states that her boss is requiring a more updated version. Requesting a call back to advise.

## 2022-05-26 NOTE — Telephone Encounter (Signed)
Order placed for CT of CAP, rad scheduling to contact pt regarding appt.

## 2022-05-26 NOTE — Telephone Encounter (Signed)
Phoned patient to let her know I would be leaving a work note for her at the front desk on the 4th floor.

## 2022-05-27 LAB — CEA: CEA: 3.4 ng/mL — ABNORMAL HIGH

## 2022-05-27 NOTE — Progress Notes (Signed)
I spoke with Ms Axe.  She is agreeable to an appt with Cira Rue, N and Dr Burr Medico on 06/02/2022 at 1100 arrive by 1045.  She is aware of location.  I provided my direct number should she need to contact me.  All questions were answered.  She verbalized understanding.

## 2022-05-27 NOTE — Telephone Encounter (Signed)
Updated patient work note per Dr. Dayle Points instructions.  Called patient and will leave letter at front desk to be picked up by patient.

## 2022-06-02 ENCOUNTER — Ambulatory Visit (HOSPITAL_COMMUNITY)
Admission: RE | Admit: 2022-06-02 | Discharge: 2022-06-02 | Disposition: A | Discharge status: Home / Self Care | Payer: BC Managed Care – PPO | Source: Ambulatory Visit | Attending: Gastroenterology | Admitting: Gastroenterology

## 2022-06-02 DIAGNOSIS — C189 Malignant neoplasm of colon, unspecified: Secondary | ICD-10-CM | POA: Diagnosis not present

## 2022-06-02 DIAGNOSIS — C19 Malignant neoplasm of rectosigmoid junction: Secondary | ICD-10-CM | POA: Diagnosis not present

## 2022-06-02 DIAGNOSIS — N200 Calculus of kidney: Secondary | ICD-10-CM | POA: Diagnosis not present

## 2022-06-02 DIAGNOSIS — M19012 Primary osteoarthritis, left shoulder: Secondary | ICD-10-CM | POA: Diagnosis not present

## 2022-06-02 DIAGNOSIS — K6389 Other specified diseases of intestine: Secondary | ICD-10-CM | POA: Diagnosis not present

## 2022-06-02 MED ORDER — IOHEXOL 9 MG/ML PO SOLN
ORAL | Status: AC
  Filled 2022-06-02: qty 1000

## 2022-06-02 MED ORDER — IOHEXOL 300 MG/ML  SOLN
100.0000 mL | Freq: Once | INTRAMUSCULAR | Status: AC | PRN
  Administered 2022-06-02: 100 mL via INTRAVENOUS

## 2022-06-02 MED ORDER — IOHEXOL 9 MG/ML PO SOLN
1000.0000 mL | ORAL | Status: AC
  Administered 2022-06-02: 1000 mL via ORAL

## 2022-06-03 ENCOUNTER — Other Ambulatory Visit: Payer: Self-pay

## 2022-06-03 ENCOUNTER — Encounter: Payer: Self-pay | Admitting: Nurse Practitioner

## 2022-06-03 ENCOUNTER — Telehealth: Payer: Self-pay | Admitting: Family Medicine

## 2022-06-03 ENCOUNTER — Inpatient Hospital Stay: Payer: BC Managed Care – PPO | Attending: Nurse Practitioner | Admitting: Nurse Practitioner

## 2022-06-03 VITALS — BP 149/89 | HR 63 | Temp 98.3°F | Resp 17 | Wt 181.7 lb

## 2022-06-03 DIAGNOSIS — C19 Malignant neoplasm of rectosigmoid junction: Secondary | ICD-10-CM | POA: Insufficient documentation

## 2022-06-03 DIAGNOSIS — I10 Essential (primary) hypertension: Secondary | ICD-10-CM | POA: Diagnosis not present

## 2022-06-03 DIAGNOSIS — C187 Malignant neoplasm of sigmoid colon: Secondary | ICD-10-CM | POA: Insufficient documentation

## 2022-06-03 NOTE — Progress Notes (Signed)
I emailed request for MMR testing on biopsy specimen to Dr Donneta Romberg at [facility].

## 2022-06-03 NOTE — Progress Notes (Signed)
I met with Sara Dorsey and her husband after her consultation with Cira Rue, NP and Dr Burr Medico.  I explained my role as a nurse navigator and provided my contact information.

## 2022-06-03 NOTE — Telephone Encounter (Signed)
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 She is already set up with oncology and had her fisrts visit today, They plan pelvic MRI and maybe some further staging with intended surgical procedure soon. She is doing OK.

## 2022-06-03 NOTE — Progress Notes (Addendum)
[facility]   [phone] Fax:([phone]) [place] Note   Patient Care Team: Dickie La, MD as PCP - General (Family Medicine) Truitt Merle, MD as Consulting Physician (Oncology) Daryel November, MD as Consulting Physician (Gastroenterology) Leighton Ruff, MD as Consulting Physician (General Surgery) Truitt Merle, MD as Consulting Physician (Hematology) 06/03/2022  CHIEF COMPLAINTS/PURPOSE OF CONSULTATION:  Colorectal cancer, referred by GI Dr. Dustin Flock  Oncology History  Rectosigmoid cancer [facility])  05/25/2022 Procedure   Colonoscopy by Dr. Dustin Flock - Likely malignant partially obstructing tumor in the recto-sigmoid colon. Biopsied. Tattooed. - One 14 mm polyp at the recto-sigmoid colon. This polyp was not removed due to it's very close proximity to the mass. - One 12 mm polyp in the ascending colon, removed with a cold snare. Resected and retrieved. - One 3 mm polyp in the descending colon, removed with a cold snare. Resected and retrieved. - The distal rectum and anal verge are normal on retroflexion view.   05/25/2022 Tumor Marker   CEA 3.4   06/02/2022 Imaging   CT CAP IMPRESSION: 1. Short segment asymmetric nodular wall thickening of the distal sigmoid colon/proximal rectum, likely reflecting patient's known primary colonic neoplasm. 2. No evidence of metastatic disease within the chest, abdomen or pelvis. 3. Nonobstructive punctate bilateral renal stones. 4. Enlarged uterus with multiple intrauterine masses measuring up to 5.1 cm, compatible with uterine leiomyomas.     06/03/2022 Initial Diagnosis   Rectosigmoid cancer ([place])    Initial Biopsy   1. Colon, polyp(s), Ascending, Descending, x 2 - TUBULAR ADENOMA(S). - NO HIGH GRADE DYSPLASIA OR MALIGNANCY. 2. Rectosigmoid , biopsies - INVASIVE ADENOCARCINOMA, MODERATELY DIFFERENTIATED (SEE NOTE)      HISTORY OF PRESENTING ILLNESS:  Sara Dorsey 51  y.o. female with no significant past medical history except HTN is here because of diagnosed colorectal cancer.  She underwent hemorrhoid banding by Dr. Marcello Moores 02/09/2022, had persistent but intermittent rectal bleeding and was referred for first colonoscopy performed by Dr. Candis Schatz on 05/25/2022 which showed a polypoid partially obstructing large mass at the rectosigmoid colon measuring 4 cm in length with the distal edge at approximately 17 cm from the anal verge.  No bleeding was present.  Additionally she had 3 other polyps which were tubular adenomas.  The rectosigmoid mass returned moderately differentiated invasive adenocarcinoma.  Baseline CEA is normal at 3.4.  Staging CT CAP 06/02/2022 showed short segment asymmetric nodular wall thickening of the distal sigmoid colon/proximal rectum consistent with the known primary, nonobstructing bilateral renal stones, and multiple intrauterine masses measuring up to 5.1 cm compatible with leiomyomas, overall no evidence of metastatic disease in the chest, abdomen, or pelvis.  She is scheduled to see Dr. Marcello Moores next Monday.  Socially she is married, lives with her spouse, has 1 healthy adult son.  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  She is dependent with ADLs, healthy and active, a runner.  Up-to-date on age-appropriate cancer screenings.  Still having menstrual periods.  Denies alcohol, drug use, or tobacco use.  Denies any family history of cancer.  Today she presents with her spouse.  She has normal energy and appetite, denies unintentional weight loss.  She notes intermittent but mild rectal bleeding and occasional lower abdominal cramping.  Denies nausea/vomiting, fever, chills, cough, chest pain, dyspnea, or pain.  MEDICAL HISTORY:  Past Medical History:  Diagnosis Date   Hypertension     SURGICAL HISTORY: Past Surgical History:  Procedure Laterality Date  TUBAL LIGATION  2000    SOCIAL HISTORY: Social History   Socioeconomic History    Marital status: Married    Spouse name: Not on file   Number of children: 1   Years of education: Not on file   Highest education level: Not on file  Occupational History   Not on file  Tobacco Use   Smoking status: Never    Passive exposure: Never   Smokeless tobacco: Never  Vaping Use   Vaping Use: Never used  Substance and Sexual Activity   Alcohol use: Never   Drug use: Never   Sexual activity: Yes    Partners: Male  Other Topics Concern   Not on file  Social History Narrative   Not on file   Social Determinants of Health   Financial Resource Strain: Not on file  Food Insecurity: Not on file  Transportation Needs: Not on file  Physical Activity: Not on file  Stress: Not on file  Social Connections: Not on file  Intimate Partner Violence: Not on file    FAMILY HISTORY: Family History  Problem Relation Age of Onset   Hypertension Mother    Colon cancer Neg Hx    Esophageal cancer Neg Hx    Rectal cancer Neg Hx    Breast cancer Neg Hx     ALLERGIES:  is allergic to lisinopril.  MEDICATIONS:  Current Outpatient Medications  Medication Sig Dispense Refill   amLODipine (NORVASC) 2.5 MG tablet Take 1 tablet (2.5 mg total) by mouth at bedtime. 90 tablet 3   fluticasone (FLONASE) 50 MCG/ACT nasal spray Place 2 sprays into both nostrils daily. 16 g 12   No current facility-administered medications for this visit.    REVIEW OF SYSTEMS:   Constitutional: Denies fatigue, decreased appetite, unintentional weight loss, fevers, chills or abnormal night sweats Eyes: Denies blurriness of vision, double vision or watery eyes Ears, nose, mouth, throat, and face: Denies mucositis or sore throat Respiratory: Denies cough, dyspnea or wheezes Cardiovascular: Denies palpitation, chest discomfort or lower extremity swelling Gastrointestinal:  Denies nausea, vomiting, diarrhea, heartburn or change in bowel habits (+) intermittent hematochezia Skin: Denies abnormal skin  rashes Lymphatics: Denies new lymphadenopathy or easy bruising Neurological:Denies numbness, tingling or new weaknesses Behavioral/Psych: Mood is stable, no new changes  All other systems were reviewed with the patient and are negative.  PHYSICAL EXAMINATION: ECOG PERFORMANCE STATUS: 0 - Asymptomatic  Vitals:   06/03/22 1057  BP: (!) 149/89  Pulse: 63  Resp: 17  Temp: 98.3 F (36.8 C)  SpO2: 100%   Filed Weights   06/03/22 1057  Weight: 181 lb 11.2 oz (82.4 kg)    GENERAL:alert, no distress and comfortable SKIN: No rash EYES: sclera clear LYMPH:  no palpable cervical, supraclavicular, or inguinal lymphadenopathy  LUNGS: clear with normal breathing effort HEART: regular rate & rhythm, no lower extremity edema ABDOMEN: abdomen soft, non-tender and normal bowel sounds PSYCH: alert & oriented x 3 with fluent speech NEURO: no focal motor/sensory deficits  LABORATORY DATA:  I have reviewed the data as listed    Latest Ref Rng & Units 05/25/2022    4:34 PM 10/22/2009    3:42 PM  CBC  WBC 4.0 - 10.5 K/uL 8.6  14.0   Hemoglobin 12.0 - 15.0 g/dL 11.9  13.9   Hematocrit 36.0 - 46.0 % 36.6  40.9   Platelets 150.0 - 400.0 K/uL 390.0  282       Latest Ref Rng & Units 05/25/2022  4:34 PM 01/08/2021   10:59 AM 01/15/2016    8:54 AM  CMP  Glucose 70 - 99 mg/dL 84  90  90   BUN 6 - 23 mg/dL _0 Creatinine 0.40 - 1.20 mg/dL 0.77  0.78  1.01   Sodium 135 - 145 mEq/L 139  138  134   Potassium 3.5 - 5.1 mEq/L 3.7  4.1  3.8   Chloride 96 - 112 mEq/L 106  103  101   CO2 19 - 32 mEq/L _1 Calcium 8.4 - 10.5 mg/dL 8.8  9.5  9.3   Total Protein 6.0 - 8.3 g/dL 8.0  7.5    Total Bilirubin 0.2 - 1.2 mg/dL 0.4  0.3    Alkaline Phos 39 - 117 U/L 71  74    AST 0 - 37 U/L 14  18    ALT 0 - 35 U/L 9  10       RADIOGRAPHIC STUDIES: I have personally reviewed the radiological images as listed and agreed with the findings in the report. CT CHEST ABDOMEN PELVIS W  CONTRAST  Result Date: 06/03/2022 CLINICAL DATA:  Colorectal cancer staging.  * Tracking Code: BO * EXAM: CT CHEST, ABDOMEN, AND PELVIS WITH CONTRAST TECHNIQUE: Multidetector CT imaging of the chest, abdomen and pelvis was performed following the standard protocol during bolus administration of intravenous contrast. RADIATION DOSE REDUCTION: This exam was performed according to the departmental dose-optimization program which includes automated exposure control, adjustment of the mA and/or kV according to patient size and/or use of iterative reconstruction technique. CONTRAST:  124m OMNIPAQUE IOHEXOL 300 MG/ML  SOLN COMPARISON:  None Available. FINDINGS: CT CHEST FINDINGS Cardiovascular: Normal caliber thoracic aorta. No central pulmonary embolus on this nondedicated study. Normal size heart. No significant pericardial effusion/thickening. Mediastinum/Nodes: No supraclavicular adenopathy. No mediastinal, hilar or axillary lymph nodes. The esophagus is grossly unremarkable. Lungs/Pleura: No suspicious pulmonary nodules or masses. No focal airspace consolidation. No pleural effusion. No pneumothorax. Musculoskeletal: No aggressive lytic or blastic lesion of bone. Left rotator cuff calcific tendinosis multilevel degenerative changes spine. CT ABDOMEN PELVIS FINDINGS Hepatobiliary: No suspicious hepatic lesion. Gallbladder is unremarkable. No biliary ductal dilation. Pancreas: No pancreatic ductal dilation or evidence of acute inflammation. Spleen: No splenomegaly or focal splenic lesion. Adrenals/Urinary Tract: Bilateral adrenal glands appear normal. Nonobstructive punctate bilateral renal stones. No hydronephrosis. 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 Urinary bladder is unremarkable for degree of distension. Stomach/Bowel: Stomach is minimally distended limiting evaluation. No pathologic dilation of large or small bowel. The appendix appears normal. No evidence of acute bowel  inflammation. Short segment asymmetric nodular wall thickening of the distal sigmoid colon/proximal rectum for instance on image 92-93/2, 96/6 and 100/5. Vascular/Lymphatic: Normal caliber abdominal aorta. No pathologically enlarged abdominal or pelvic lymph nodes. Reproductive: Enlarged uterus with lobular uterine contour and multiple intrauterine masses measuring up to 5.1 cm on image 90/2 compatible with uterine leiomyomas. No suspicious adnexal mass. Other: No significant abdominopelvic free fluid. No discrete omental or peritoneal nodularity. Musculoskeletal: No aggressive lytic or blastic lesion of bone. Multilevel degenerative change of the spine. Degenerative change of the bilateral hips and SI joints. IMPRESSION: 1. Short segment asymmetric nodular wall thickening of the distal sigmoid colon/proximal rectum, likely reflecting patient's known primary colonic neoplasm. 2. No evidence of metastatic disease within the chest, abdomen or pelvis. 3. Nonobstructive punctate bilateral renal stones. 4. Enlarged uterus with multiple intrauterine masses measuring  up to 5.1 cm, compatible with uterine leiomyomas. Electronically Signed   By: Dahlia Bailiff M.D.   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  She presented with intermittent rectal bleeding s/p hemorrhoid banding on 02/09/2022 worth persistent hematochezia.  First colonoscopy showed partially obstructing rectosigmoid tumor, path confirmed G2 invasive adenocarcinoma. We will request MMR -Baseline CEA normal, 3.4 -We reviewed her staging CT CAP which is negative for distant metastasis or local regional adenopathy.  We reviewed that this is likely early stage disease but that colon cancer and rectal cancer are treated differently.  -We are referring her for pelvic MRI for local  staging, given the involvement of the proximal rectum. She agrees. We will call her with results  -She is scheduled to meet with Dr. Marcello Moores 06/08/22 -We plan to review her case in GI conference next week, to determine if she needs neoadjuvant therapy. We briefly discussed possible scenarios including chemo and/or chemoRT.  -She understands if she does surgery first, the final stage is determined in surgery and if she has any +LN involvement, we will recommend adjuvant chemo.  -Ms. Dioguardi is young and healthy, no significant co-morbidities, with good family support. She would be a good candidate for chemo if necessary.  -Recent labs reviewed, mild anemia Hgb 11.9; will check iron studies at next visit. Normal renal and liver function -We discussed the risk of cancer recurrence in the future and the surveillance plan, which is a physical exam and lab test (including CBC, CMP and CEA) every 3 months for the first 2 years, then every 6-12 months, colonoscopy one year from surgery, and surveilliance CT scan every 6-12 month for up to 3 years (unless she has more advanced disease). -F/up pending surgical consult and MRI results; if she has surgery first, we will see her after surgery to review final path and any adjuvant treatment that may be necessary. -I answered many questions about cancer in general, activity, nutrition, etc. -Pt seen with Dr. Burr Medico and GI nurse Navigator, Ihor Gully    2. Hematochezia, cramping -Secondary to #1 -mild  3. HTN -on amlodipine    PLAN: -colonoscopy, path, and CT reviewed -Request MMR on initial biopsy -Pelvic MRI for local staging in next week -Consult with Dr. Marcello Moores 12/4 -Discuss case in GI conference next week -F/up pending MRI and surgical plan    Orders Placed This Encounter  Procedures   MR PELVIS WO CM RECTAL CA STAGING    Standing Status:   Future    Standing Expiration Date:   06/04/2023    Order Specific Question:   If indicated for the  ordered procedure, I authorize the administration of contrast media per Radiology protocol    Answer:   Yes    Order Specific Question:   What is the patient's sedation requirement?    Answer:   No Sedation    Order Specific Question:   Does the patient have a pacemaker or implanted devices?    Answer:   No    Order Specific Question:   Preferred imaging location?    Answer:   [facility] (table limit - 550 lbs)   CBC with Differential (Cancer Center Only)    Standing Status:   Future    Standing Expiration Date:   06/04/2023   CEA (Access)-[facility] ONLY    Standing  Status:   Future    Standing Expiration Date:   06/04/2023   CMP ([facility] only)    Standing Status:   Future    Standing Expiration Date:   06/04/2023   Ferritin    Standing Status:   Future    Standing Expiration Date:   06/04/2023   Iron and Iron Binding Capacity ([facility] only)    Standing Status:   Future    Standing Expiration Date:   06/04/2023     All questions were answered. The patient knows to call the clinic with any problems, questions or concerns.     Alla Feeling, NP 06/03/2022    Addendum I have seen the patient, examined her. I agree with the assessment and and plan and have edited the notes.   51 year old female who presents intermittent hematochezia, status post hemorrhoids banding by Dr. Marcello Moores.  Her colonoscopy unfortunately reviewed a partially obstructive mass in the sigmoid colon and upper rectum, patient CT is negative for nodal or distant metastasis.  I reviewed her biopsy results, and scan findings.  I recommend pelvic MRI with and without contrast for further staging.  She is scheduled to see her colorectal surgeon Dr. Marcello Moores next Monday.  Will review her case in our GI conference next week, to see if she is a candidate for upfront surgery, versus neoadjuvant chemotherapy w and wo radiation.  We also discussed the indication for adjuvant chemotherapy if she undergo surgery first.  Will request MMR on her biopsy sample. All questions were answered.  Truitt Merle MD 06/03/2022

## 2022-06-08 ENCOUNTER — Ambulatory Visit: Payer: Self-pay | Admitting: General Surgery

## 2022-06-08 DIAGNOSIS — C187 Malignant neoplasm of sigmoid colon: Secondary | ICD-10-CM | POA: Diagnosis not present

## 2022-06-08 NOTE — H&P (Signed)
REFERRING PHYSICIAN:  Patrice Paradise*  PROVIDER:  Monico Blitz, MD  MRN: [id number] DOB: January 19, 1971 DATE OF ENCOUNTER: 06/08/2022  Subjective   Chief Complaint: No chief complaint on file.     History of Present Illness: Sara Dorsey is a 51 y.o. female who is seen today as an office consultation at the request of Dr. Candis Schatz for evaluation of No chief complaint on file. .  51 year old female who underwent a screening colonoscopy on May 25, 2022.  She was noted to have a partially obstructing mass in the rectosigmoid area approximately 10 cm from the anal verge.  This was biopsied and tattooed 3 to 4 cm proximally.  Several other polyps were removed as well.  CT scan showed no signs of metastatic disease with a mass noted in the distal sigmoid colon.  Biopsy showed moderately invasive adenocarcinoma.  CEA was normal   Review of Systems: A complete review of systems was obtained from the patient.  I have reviewed this information and discussed as appropriate with the patient.  See HPI as well for other ROS.    Medical History: Past Medical History:  Diagnosis Date   Hypertension     There is no problem list on file for this patient.   History reviewed. No pertinent surgical history.   No Known Allergies  Current Outpatient Medications on File Prior to Visit  Medication Sig Dispense Refill   amLODIPine (NORVASC) 2.5 MG tablet Take 2.5 mg by mouth at bedtime     No current facility-administered medications on file prior to visit.    History reviewed. No pertinent family history.   Social History   Tobacco Use  Smoking Status Never  Smokeless Tobacco Never     Social History   Socioeconomic History   Marital status: Married  Tobacco Use   Smoking status: Never   Smokeless tobacco: Never  Vaping Use   Vaping Use: Never used  Substance and Sexual Activity   Alcohol use: Never   Drug use: Never    Objective:    There  were no vitals filed for this visit.   Exam Gen: NAD Abd: soft    Labs, Imaging and Diagnostic Testing: CT scan report and images personally reviewed.  Colonoscopy report reviewed  Assessment and Plan:  Diagnoses and all orders for this visit:  Cancer of sigmoid colon (CMS-HCC)    51 year old female with rectal bleeding who underwent colonoscopy and was noted to have a rectosigmoid mass.  Biopsy showed adenocarcinoma.  CT scan shows no signs of metastatic disease.  The mass is tattooed approximately 3 to 4 cm proximally.  Patient has no past surgical history except for tubal ligation.  I have recommended robotic assisted partial colectomy.  This mass appears to be above the area of the rectosigmoid on CT scan and I do not think any further imaging or treatment is needed due to concern for this being a rectal cancer .   The surgery and anatomy were described to the patient as well as the risks of surgery and the possible complications.  These include: Bleeding, deep abdominal infections and possible wound complications such as hernia and infection, damage to adjacent structures, leak of surgical connections, which can lead to other surgeries and possibly an ostomy, possible need for other procedures, such as abscess drains in radiology, possible prolonged hospital stay, possible diarrhea from removal of part of the colon, possible constipation from narcotics, possible bowel, bladder or sexual dysfunction if having rectal surgery,  prolonged fatigue/weakness or appetite loss, possible early recurrence of of disease, possible complications of their medical problems such as heart disease or arrhythmias or lung problems, death (less than 1%). I believe the patient understands and wishes to proceed with the surgery.  No follow-ups on file.    Rosario Adie, MD Colon and Rectal Surgery [facility]

## 2022-06-10 ENCOUNTER — Other Ambulatory Visit: Payer: Self-pay

## 2022-06-10 NOTE — Progress Notes (Signed)
The proposed treatment discussed in conference is for discussion purpose only and is not a binding recommendation.  The patients have not been physically examined, or presented with their treatment options.  Therefore, final treatment plans cannot be decided.  

## 2022-06-12 ENCOUNTER — Telehealth: Payer: Self-pay

## 2022-06-12 NOTE — Telephone Encounter (Signed)
LVM stating that pt's MRI has been approved by her insurance.  Instructed pt to contact Central Scheduling at [phone] option#3 to schedule the MRI.  Instructed pt to contact Dr. Burr Medico or Cira Rue, NP office should she have additional questions or concerns.

## 2022-06-15 ENCOUNTER — Telehealth: Payer: Self-pay

## 2022-06-15 NOTE — Telephone Encounter (Signed)
LVM instructing pt to contact Central Scheduling Team at [phone] option#3 to schedule the MRI Dr. Burr Medico ordered for the pt.  Instructed pt to contact Dr. Ernestina Penna office should she have additional questions or concerns.

## 2022-06-16 ENCOUNTER — Ambulatory Visit (HOSPITAL_COMMUNITY): Payer: BC Managed Care – PPO

## 2022-06-18 ENCOUNTER — Telehealth: Payer: Self-pay

## 2022-06-18 NOTE — Progress Notes (Addendum)
Anesthesia Review:  PCP: Dorcas Mcmurray  Cardiologist : none  Chest x-ray : EKG :06/22/22  Echo : Stress test: Cardiac Cath :  Activity level can do a flight of stairs without difficutly   Sleep Study/ CPAP  none  Fasting Blood Sugar :      / Checks Blood Sugar -- times a day:   Blood Thinner/ Instructions /Last Dose: ASA / Instructions/ Last Dose : Anesthesia Review:   Works in Pharmacist, community at [employer]    PT has bowel prep instructions per Psychologist, sport and exercise.

## 2022-06-18 NOTE — Telephone Encounter (Signed)
Spoke with pt's spouse Elberta Fortis regarding scheduling pt's MRI rectal staging.  Elberta Fortis stated they met with Dr. Leighton Ruff and she stated that the MRI was not needed since the pt had a CT Scan in November 2023.  Since Dr. Marcello Moores did not feel the MRI was needed Elberta Fortis stated they cancel the original appt for the MRI.  Notified Dr. Burr Medico of the conversation with pt's spouse.

## 2022-06-18 NOTE — Patient Instructions (Signed)
SURGICAL WAITING ROOM VISITATION Patients having surgery or a procedure may have no more than 2 support people in the waiting area - these visitors may rotate.   Children under the age of 56 must have an adult with them who is not the patient. If the patient needs to stay at the hospital during part of their recovery, the visitor guidelines for inpatient rooms apply. Pre-op nurse will coordinate an appropriate time for 1 support person to accompany patient in pre-op.  This support person may not rotate.    Please refer to the [facility] website for the visitor guidelines for Inpatients (after your surgery is over and you are in a regular room).       Your procedure is scheduled on: 07/09/22    Report to [facility] Main Entrance    Report to admitting at   [phone]   Call this number if you have problems the morning of surgery [phone]  Clear liquid diet on day of bowel prep.    After Midnight you may have the following liquids until __0430____ AM  DAY OF SURGERY  Water Non-Citrus Juices (without pulp, NO RED) Carbonated Beverages Black Coffee (NO MILK/CREAM OR CREAMERS, sugar ok)  Clear Tea (NO MILK/CREAM OR CREAMERS, sugar ok) regular and decaf                             Plain Jell-O (NO RED)                                           Fruit ices (not with fruit pulp, NO RED)                                     Popsicles (NO RED)                                                               Sports drinks like Gatorade (NO RED)              Drink 2 Ensure/G2 drinks AT 10:00 PM the night before surgery.        The day of surgery:  Drink ONE (1) Pre-Surgery Clear Ensure or G2 at  0430 AM  ( have completed by ) the morning of surgery. Drink in one sitting. Do not sip.  This drink was given to you during your hospital  pre-op appointment visit. Nothing else to drink after completing the  Pre-Surgery Clear Ensure or G2.          If you have questions, please contact  your surgeon's office.   FOLLOW BOWEL PREP AND ANY ADDITIONAL PRE OP INSTRUCTIONS YOU RECEIVED FROM YOUR SURGEON'S OFFICE!!!     Oral Hygiene is also important to reduce your risk of infection.                                    Remember - BRUSH YOUR TEETH THE MORNING OF SURGERY WITH YOUR REGULAR TOOTHPASTE  DENTURES WILL BE REMOVED PRIOR TO SURGERY PLEASE DO NOT APPLY "Poly grip" OR ADHESIVES!!!   Do NOT smoke after Midnight   Take these medicines the morning of surgery with A SIP OF WATER:  flonase   DO NOT TAKE ANY ORAL DIABETIC MEDICATIONS DAY OF YOUR SURGERY  Bring CPAP mask and tubing day of surgery.                              You may not have any metal on your body including hair pins, jewelry, and body piercing             Do not wear make-up, lotions, powders, perfumes/cologne, or deodorant  Do not wear nail polish including gel and S&S, artificial/acrylic nails, or any other type of covering on natural nails including finger and toenails. If you have artificial nails, gel coating, etc. that needs to be removed by a nail salon please have this removed prior to surgery or surgery may need to be canceled/ delayed if the surgeon/ anesthesia feels like they are unable to be safely monitored.   Do not shave  48 hours prior to surgery.               Men may shave face and neck.   Do not bring valuables to the hospital. [place].   Contacts, glasses, dentures or bridgework may not be worn into surgery.   Bring small overnight bag day of surgery.   DO NOT [place]. PHARMACY WILL DISPENSE MEDICATIONS LISTED ON YOUR MEDICATION LIST TO YOU DURING YOUR ADMISSION [place]!    Patients discharged on the day of surgery will not be allowed to drive home.  Someone NEEDS to stay with you for the first 24 hours after anesthesia.   Special Instructions: Bring a copy of your healthcare power of  attorney and living will documents the day of surgery if you haven't scanned them before.              Please read over the following fact sheets you were given: IF [place] [phone]   If you received a COVID test during your pre-op visit  it is requested that you wear a mask when out in public, stay away from anyone that may not be feeling well and notify your surgeon if you develop symptoms. If you test positive for Covid or have been in contact with anyone that has tested positive in the last 10 days please notify you surgeon.    [facility] - Preparing for Surgery Before surgery, you can play an important role.  Because skin is not sterile, your skin needs to be as free of germs as possible.  You can reduce the number of germs on your skin by washing with CHG (chlorahexidine gluconate) soap before surgery.  CHG is an antiseptic cleaner which kills germs and bonds with the skin to continue killing germs even after washing. Please DO NOT use if you have an allergy to CHG or antibacterial soaps.  If your skin becomes reddened/irritated stop using the CHG and inform your nurse when you arrive at Short Stay. Do not shave (including legs and underarms) for at least 48 hours prior to the first CHG shower.  You may shave  your face/neck. Please follow these instructions carefully:  1.  Shower with CHG Soap the night before surgery and the  morning of Surgery.  2.  If you choose to wash your hair, wash your hair first as usual with your  normal  shampoo.  3.  After you shampoo, rinse your hair and body thoroughly to remove the  shampoo.                           4.  Use CHG as you would any other liquid soap.  You can apply chg directly  to the skin and wash                       Gently with a scrungie or clean washcloth.  5.  Apply the CHG Soap to your body ONLY FROM THE NECK DOWN.   Do not use on face/ open                           Wound or open  sores. Avoid contact with eyes, ears mouth and genitals (private parts).                       Wash face,  Genitals (private parts) with your normal soap.             6.  Wash thoroughly, paying special attention to the area where your surgery  will be performed.  7.  Thoroughly rinse your body with warm water from the neck down.  8.  DO NOT shower/wash with your normal soap after using and rinsing off  the CHG Soap.                9.  Pat yourself dry with a clean towel.            10.  Wear clean pajamas.            11.  Place clean sheets on your bed the night of your first shower and do not  sleep with pets. Day of Surgery : Do not apply any lotions/deodorants the morning of surgery.  Please wear clean clothes to the hospital/surgery center.  FAILURE TO FOLLOW THESE INSTRUCTIONS MAY RESULT IN THE CANCELLATION OF YOUR SURGERY PATIENT SIGNATURE_________________________________  NURSE SIGNATURE__________________________________  ________________________________________________________________________

## 2022-06-19 ENCOUNTER — Other Ambulatory Visit: Payer: Self-pay

## 2022-06-22 ENCOUNTER — Encounter (HOSPITAL_COMMUNITY): Payer: Self-pay

## 2022-06-22 ENCOUNTER — Other Ambulatory Visit: Payer: Self-pay

## 2022-06-22 ENCOUNTER — Encounter (HOSPITAL_COMMUNITY)
Admission: RE | Admit: 2022-06-22 | Discharge: 2022-06-22 | Disposition: A | Discharge status: Home / Self Care | Payer: BC Managed Care – PPO | Source: Ambulatory Visit | Attending: General Surgery | Admitting: General Surgery

## 2022-06-22 VITALS — BP 139/85 | HR 60 | Temp 98.6°F | Resp 16 | Ht 62.0 in | Wt 180.0 lb

## 2022-06-22 DIAGNOSIS — Z01818 Encounter for other preprocedural examination: Secondary | ICD-10-CM | POA: Diagnosis not present

## 2022-06-22 LAB — CBC
HCT: 35.9 % — ABNORMAL LOW (ref 36.0–46.0)
Hemoglobin: 11.3 g/dL — ABNORMAL LOW (ref 12.0–15.0)
MCH: 27.5 pg (ref 26.0–34.0)
MCHC: 31.5 g/dL (ref 30.0–36.0)
MCV: 87.3 fL (ref 80.0–100.0)
Platelets: 341 10*3/uL (ref 150–400)
RBC: 4.11 MIL/uL (ref 3.87–5.11)
RDW: 13.7 % (ref 11.5–15.5)
WBC: 9.3 10*3/uL (ref 4.0–10.5)
nRBC: 0 % (ref 0.0–0.2)

## 2022-06-22 LAB — BASIC METABOLIC PANEL
Anion gap: 7 (ref 5–15)
BUN: 14 mg/dL (ref 6–20)
CO2: 24 mmol/L (ref 22–32)
Calcium: 9.4 mg/dL (ref 8.9–10.3)
Chloride: 108 mmol/L (ref 98–111)
Creatinine, Ser: 0.76 mg/dL (ref 0.44–1.00)
GFR, Estimated: >60 mL/min (ref >60)
Glucose, Bld: 105 mg/dL — ABNORMAL HIGH (ref 70–99)
Potassium: 3.7 mmol/L (ref 3.5–5.1)
Sodium: 139 mmol/L (ref 135–145)

## 2022-06-25 ENCOUNTER — Encounter: Payer: Self-pay | Admitting: Family Medicine

## 2022-07-09 ENCOUNTER — Inpatient Hospital Stay (HOSPITAL_COMMUNITY)
Admission: RE | Admit: 2022-07-09 | Discharge: 2022-07-11 | DRG: 331 | Disposition: A | Discharge status: Home / Self Care | Payer: BC Managed Care – PPO | Attending: General Surgery | Admitting: General Surgery

## 2022-07-09 ENCOUNTER — Other Ambulatory Visit: Payer: Self-pay

## 2022-07-09 ENCOUNTER — Encounter (HOSPITAL_COMMUNITY)
Admission: RE | Disposition: A | Discharge status: Home / Self Care | Payer: Self-pay | Source: Home / Self Care | Attending: General Surgery

## 2022-07-09 ENCOUNTER — Inpatient Hospital Stay (HOSPITAL_COMMUNITY): Payer: BC Managed Care – PPO | Admitting: Physician Assistant

## 2022-07-09 ENCOUNTER — Encounter (HOSPITAL_COMMUNITY): Payer: Self-pay | Admitting: General Surgery

## 2022-07-09 DIAGNOSIS — N858 Other specified noninflammatory disorders of uterus: Secondary | ICD-10-CM | POA: Diagnosis not present

## 2022-07-09 DIAGNOSIS — Z79899 Other long term (current) drug therapy: Secondary | ICD-10-CM

## 2022-07-09 DIAGNOSIS — I10 Essential (primary) hypertension: Secondary | ICD-10-CM | POA: Diagnosis not present

## 2022-07-09 DIAGNOSIS — D259 Leiomyoma of uterus, unspecified: Secondary | ICD-10-CM | POA: Diagnosis not present

## 2022-07-09 DIAGNOSIS — C189 Malignant neoplasm of colon, unspecified: Secondary | ICD-10-CM | POA: Diagnosis not present

## 2022-07-09 DIAGNOSIS — Z01818 Encounter for other preprocedural examination: Secondary | ICD-10-CM

## 2022-07-09 DIAGNOSIS — C187 Malignant neoplasm of sigmoid colon: Secondary | ICD-10-CM | POA: Diagnosis not present

## 2022-07-09 DIAGNOSIS — C19 Malignant neoplasm of rectosigmoid junction: Secondary | ICD-10-CM | POA: Diagnosis not present

## 2022-07-09 LAB — ABO/RH: ABO/RH(D): A POS

## 2022-07-09 LAB — TYPE AND SCREEN
ABO/RH(D): A POS
Antibody Screen: NEGATIVE

## 2022-07-09 SURGERY — COLECTOMY, PARTIAL, ROBOT-ASSISTED, LAPAROSCOPIC
Anesthesia: General | Site: Abdomen

## 2022-07-09 MED ORDER — ORAL CARE MOUTH RINSE: 15.0000 mL | Freq: Once | OROMUCOSAL | Status: AC

## 2022-07-09 MED ORDER — ONDANSETRON HCL 4 MG/2ML IJ SOLN: 4.0000 mg | Freq: Once | INTRAMUSCULAR | Status: DC | PRN

## 2022-07-09 MED ORDER — MIDAZOLAM HCL 2 MG/2ML IJ SOLN
INTRAMUSCULAR | Status: AC
  Filled 2022-07-09: qty 2

## 2022-07-09 MED ORDER — ENOXAPARIN SODIUM 40 MG/0.4ML IJ SOSY
40.0000 mg | PREFILLED_SYRINGE | INTRAMUSCULAR | Status: DC
  Administered 2022-07-10: 40 mg via SUBCUTANEOUS
  Filled 2022-07-09 (×2): qty 0.4

## 2022-07-09 MED ORDER — OXYCODONE HCL 5 MG PO TABS: 5.0000 mg | ORAL_TABLET | Freq: Once | ORAL | Status: DC | PRN

## 2022-07-09 MED ORDER — LACTATED RINGERS IR SOLN
Status: DC | PRN
  Administered 2022-07-09: 1000 mL

## 2022-07-09 MED ORDER — PROPOFOL 10 MG/ML IV BOLUS
INTRAVENOUS | Status: AC
  Filled 2022-07-09: qty 20

## 2022-07-09 MED ORDER — FENTANYL CITRATE (PF) 100 MCG/2ML IJ SOLN
INTRAMUSCULAR | Status: AC
  Filled 2022-07-09: qty 2

## 2022-07-09 MED ORDER — DIPHENHYDRAMINE HCL 50 MG/ML IJ SOLN: 12.5000 mg | Freq: Four times a day (QID) | INTRAMUSCULAR | Status: DC | PRN

## 2022-07-09 MED ORDER — ROCURONIUM BROMIDE 10 MG/ML (PF) SYRINGE
PREFILLED_SYRINGE | INTRAVENOUS | Status: DC | PRN
  Administered 2022-07-09: 10 mg via INTRAVENOUS
  Administered 2022-07-09: 70 mg via INTRAVENOUS

## 2022-07-09 MED ORDER — ONDANSETRON HCL 4 MG/2ML IJ SOLN: 4.0000 mg | Freq: Four times a day (QID) | INTRAMUSCULAR | Status: DC | PRN

## 2022-07-09 MED ORDER — LIDOCAINE 20MG/ML (2%) 15 ML SYRINGE OPTIME
INTRAMUSCULAR | Status: DC | PRN
  Administered 2022-07-09: 1.5 mg/kg/h via INTRAVENOUS

## 2022-07-09 MED ORDER — CHLORHEXIDINE GLUCONATE 0.12 % MT SOLN
15.0000 mL | Freq: Once | OROMUCOSAL | Status: AC
  Administered 2022-07-09: 15 mL via OROMUCOSAL

## 2022-07-09 MED ORDER — SUGAMMADEX SODIUM 200 MG/2ML IV SOLN
INTRAVENOUS | Status: DC | PRN
  Administered 2022-07-09: 200 mg via INTRAVENOUS

## 2022-07-09 MED ORDER — ONDANSETRON HCL 4 MG PO TABS: 4.0000 mg | ORAL_TABLET | Freq: Four times a day (QID) | ORAL | Status: DC | PRN

## 2022-07-09 MED ORDER — ENSURE PRE-SURGERY PO LIQD
296.0000 mL | Freq: Once | ORAL | Status: DC
  Filled 2022-07-09: qty 296

## 2022-07-09 MED ORDER — ONDANSETRON HCL 4 MG/2ML IJ SOLN
INTRAMUSCULAR | Status: AC
  Filled 2022-07-09: qty 2

## 2022-07-09 MED ORDER — DEXAMETHASONE SODIUM PHOSPHATE 10 MG/ML IJ SOLN
INTRAMUSCULAR | Status: AC
  Filled 2022-07-09: qty 1

## 2022-07-09 MED ORDER — 0.9 % SODIUM CHLORIDE (POUR BTL) OPTIME
TOPICAL | Status: DC | PRN
  Administered 2022-07-09: 2000 mL

## 2022-07-09 MED ORDER — LIDOCAINE HCL 2 % IJ SOLN
INTRAMUSCULAR | Status: AC
  Filled 2022-07-09: qty 20

## 2022-07-09 MED ORDER — ENSURE SURGERY PO LIQD
237.0000 mL | Freq: Two times a day (BID) | ORAL | Status: DC
  Administered 2022-07-10: 237 mL via ORAL

## 2022-07-09 MED ORDER — PROPOFOL 10 MG/ML IV BOLUS
INTRAVENOUS | Status: DC | PRN
  Administered 2022-07-09: 150 mg via INTRAVENOUS

## 2022-07-09 MED ORDER — OXYCODONE HCL 5 MG/5ML PO SOLN: 5.0000 mg | Freq: Once | ORAL | Status: DC | PRN

## 2022-07-09 MED ORDER — ALUM & MAG HYDROXIDE-SIMETH 200-200-20 MG/5ML PO SUSP: 30.0000 mL | Freq: Four times a day (QID) | ORAL | Status: DC | PRN

## 2022-07-09 MED ORDER — BUPIVACAINE-EPINEPHRINE 0.5% -1:200000 IJ SOLN
INTRAMUSCULAR | Status: DC | PRN
  Administered 2022-07-09: 30 mL

## 2022-07-09 MED ORDER — BUPIVACAINE LIPOSOME 1.3 % IJ SUSP: 20.0000 mL | Freq: Once | INTRAMUSCULAR | Status: DC

## 2022-07-09 MED ORDER — SODIUM CHLORIDE 0.9 % IV SOLN
2.0000 g | Freq: Two times a day (BID) | INTRAVENOUS | Status: AC
  Administered 2022-07-09: 2 g via INTRAVENOUS
  Filled 2022-07-09: qty 2

## 2022-07-09 MED ORDER — GABAPENTIN 300 MG PO CAPS
300.0000 mg | ORAL_CAPSULE | ORAL | Status: AC
  Administered 2022-07-09: 300 mg via ORAL
  Filled 2022-07-09: qty 1

## 2022-07-09 MED ORDER — BUPIVACAINE LIPOSOME 1.3 % IJ SUSP
INTRAMUSCULAR | Status: AC
  Filled 2022-07-09: qty 20

## 2022-07-09 MED ORDER — ALVIMOPAN 12 MG PO CAPS: 12.0000 mg | ORAL_CAPSULE | Freq: Two times a day (BID) | ORAL | Status: DC

## 2022-07-09 MED ORDER — GABAPENTIN 100 MG PO CAPS
300.0000 mg | ORAL_CAPSULE | Freq: Two times a day (BID) | ORAL | Status: AC
  Administered 2022-07-09 (×2): 300 mg via ORAL
  Filled 2022-07-09 (×2): qty 3

## 2022-07-09 MED ORDER — HYDROMORPHONE HCL 1 MG/ML IJ SOLN: 0.5000 mg | INTRAMUSCULAR | Status: DC | PRN

## 2022-07-09 MED ORDER — BUPIVACAINE-EPINEPHRINE (PF) 0.5% -1:200000 IJ SOLN
INTRAMUSCULAR | Status: AC
  Filled 2022-07-09: qty 30

## 2022-07-09 MED ORDER — PHENYLEPHRINE 80 MCG/ML (10ML) SYRINGE FOR IV PUSH (FOR BLOOD PRESSURE SUPPORT)
PREFILLED_SYRINGE | INTRAVENOUS | Status: AC
  Filled 2022-07-09: qty 10

## 2022-07-09 MED ORDER — POLYETHYLENE GLYCOL 3350 17 GM/SCOOP PO POWD: 1.0000 | Freq: Once | ORAL | Status: DC

## 2022-07-09 MED ORDER — LACTATED RINGERS IV SOLN: INTRAVENOUS | Status: DC

## 2022-07-09 MED ORDER — ACETAMINOPHEN 500 MG PO TABS
1000.0000 mg | ORAL_TABLET | ORAL | Status: AC
  Administered 2022-07-09: 1000 mg via ORAL
  Filled 2022-07-09: qty 2

## 2022-07-09 MED ORDER — MIDAZOLAM HCL 5 MG/5ML IJ SOLN
INTRAMUSCULAR | Status: DC | PRN
  Administered 2022-07-09: 2 mg via INTRAVENOUS

## 2022-07-09 MED ORDER — KETAMINE HCL 10 MG/ML IJ SOLN
INTRAMUSCULAR | Status: AC
  Filled 2022-07-09: qty 1

## 2022-07-09 MED ORDER — ONDANSETRON HCL 4 MG/2ML IJ SOLN
INTRAMUSCULAR | Status: DC | PRN
  Administered 2022-07-09: 4 mg via INTRAVENOUS

## 2022-07-09 MED ORDER — ACETAMINOPHEN 500 MG PO TABS
1000.0000 mg | ORAL_TABLET | Freq: Four times a day (QID) | ORAL | Status: DC
  Administered 2022-07-09 – 2022-07-11 (×7): 1000 mg via ORAL
  Filled 2022-07-09 (×8): qty 2

## 2022-07-09 MED ORDER — FENTANYL CITRATE (PF) 100 MCG/2ML IJ SOLN
INTRAMUSCULAR | Status: DC | PRN
  Administered 2022-07-09 (×2): 50 ug via INTRAVENOUS

## 2022-07-09 MED ORDER — KETOROLAC TROMETHAMINE 30 MG/ML IJ SOLN
INTRAMUSCULAR | Status: AC
  Filled 2022-07-09: qty 1

## 2022-07-09 MED ORDER — ENSURE PRE-SURGERY PO LIQD
592.0000 mL | Freq: Once | ORAL | Status: DC
  Filled 2022-07-09: qty 592

## 2022-07-09 MED ORDER — DIPHENHYDRAMINE HCL 12.5 MG/5ML PO ELIX: 12.5000 mg | ORAL_SOLUTION | Freq: Four times a day (QID) | ORAL | Status: DC | PRN

## 2022-07-09 MED ORDER — BUPIVACAINE LIPOSOME 1.3 % IJ SUSP
INTRAMUSCULAR | Status: DC | PRN
  Administered 2022-07-09: 20 mL

## 2022-07-09 MED ORDER — SODIUM CHLORIDE 0.9 % IV SOLN
2.0000 g | INTRAVENOUS | Status: AC
  Administered 2022-07-09: 2 g via INTRAVENOUS
  Filled 2022-07-09: qty 2

## 2022-07-09 MED ORDER — DEXAMETHASONE SODIUM PHOSPHATE 10 MG/ML IJ SOLN
INTRAMUSCULAR | Status: DC | PRN
  Administered 2022-07-09: 10 mg via INTRAVENOUS

## 2022-07-09 MED ORDER — ALVIMOPAN 12 MG PO CAPS
12.0000 mg | ORAL_CAPSULE | ORAL | Status: AC
  Administered 2022-07-09: 12 mg via ORAL
  Filled 2022-07-09: qty 1

## 2022-07-09 MED ORDER — KETAMINE HCL 10 MG/ML IJ SOLN
INTRAMUSCULAR | Status: DC | PRN
  Administered 2022-07-09 (×2): 10 mg via INTRAVENOUS
  Administered 2022-07-09: 20 mg via INTRAVENOUS
  Administered 2022-07-09: 10 mg via INTRAVENOUS

## 2022-07-09 MED ORDER — BISACODYL 5 MG PO TBEC: 20.0000 mg | DELAYED_RELEASE_TABLET | Freq: Once | ORAL | Status: DC

## 2022-07-09 MED ORDER — KCL IN DEXTROSE-NACL 20-5-0.45 MEQ/L-%-% IV SOLN
INTRAVENOUS | Status: DC
  Filled 2022-07-09 (×2): qty 1000

## 2022-07-09 MED ORDER — HYDROMORPHONE HCL 1 MG/ML IJ SOLN
INTRAMUSCULAR | Status: AC
  Filled 2022-07-09: qty 1

## 2022-07-09 MED ORDER — SIMETHICONE 80 MG PO CHEW
40.0000 mg | CHEWABLE_TABLET | Freq: Four times a day (QID) | ORAL | Status: DC | PRN
  Administered 2022-07-09: 40 mg via ORAL
  Filled 2022-07-09: qty 1

## 2022-07-09 MED ORDER — ROCURONIUM BROMIDE 10 MG/ML (PF) SYRINGE
PREFILLED_SYRINGE | INTRAVENOUS | Status: AC
  Filled 2022-07-09: qty 10

## 2022-07-09 MED ORDER — KETOROLAC TROMETHAMINE 30 MG/ML IJ SOLN
30.0000 mg | Freq: Once | INTRAMUSCULAR | Status: AC | PRN
  Administered 2022-07-09: 30 mg via INTRAVENOUS

## 2022-07-09 MED ORDER — HEPARIN SODIUM (PORCINE) 5000 UNIT/ML IJ SOLN
5000.0000 [IU] | Freq: Once | INTRAMUSCULAR | Status: AC
  Administered 2022-07-09: 5000 [IU] via SUBCUTANEOUS
  Filled 2022-07-09: qty 1

## 2022-07-09 MED ORDER — HYDROMORPHONE HCL 1 MG/ML IJ SOLN
0.2500 mg | INTRAMUSCULAR | Status: DC | PRN
  Administered 2022-07-09: 0.25 mg via INTRAVENOUS
  Administered 2022-07-09: 0.5 mg via INTRAVENOUS
  Administered 2022-07-09: 0.25 mg via INTRAVENOUS

## 2022-07-09 MED ORDER — PHENYLEPHRINE 80 MCG/ML (10ML) SYRINGE FOR IV PUSH (FOR BLOOD PRESSURE SUPPORT)
PREFILLED_SYRINGE | INTRAVENOUS | Status: DC | PRN
  Administered 2022-07-09: 160 ug via INTRAVENOUS
  Administered 2022-07-09: 80 ug via INTRAVENOUS

## 2022-07-09 MED ORDER — SACCHAROMYCES BOULARDII 250 MG PO CAPS
250.0000 mg | ORAL_CAPSULE | Freq: Two times a day (BID) | ORAL | Status: DC
  Administered 2022-07-09 – 2022-07-10 (×4): 250 mg via ORAL
  Filled 2022-07-09 (×5): qty 1

## 2022-07-09 SURGICAL SUPPLY — 94 items
BAG COUNTER SPONGE SURGICOUNT (BAG) ×1 IMPLANT
BLADE EXTENDED COATED 6.5IN (ELECTRODE) IMPLANT
CANNULA REDUC XI 12-8 STAPL (CANNULA)
CANNULA REDUCER 12-8 DVNC XI (CANNULA) IMPLANT
CELLS DAT CNTRL 66122 CELL SVR (MISCELLANEOUS) IMPLANT
COVER SURGICAL LIGHT HANDLE (MISCELLANEOUS) ×2 IMPLANT
COVER TIP SHEARS 8 DVNC (MISCELLANEOUS) ×1 IMPLANT
COVER TIP SHEARS 8MM DA VINCI (MISCELLANEOUS) ×1
DRAIN CHANNEL 19F RND (DRAIN) IMPLANT
DRAPE ARM DVNC X/XI (DISPOSABLE) ×4 IMPLANT
DRAPE COLUMN DVNC XI (DISPOSABLE) ×1 IMPLANT
DRAPE DA VINCI XI ARM (DISPOSABLE) ×4
DRAPE DA VINCI XI COLUMN (DISPOSABLE) ×1
DRAPE SURG IRRIG POUCH 19X23 (DRAPES) ×1 IMPLANT
DRSG OPSITE POSTOP 4X10 (GAUZE/BANDAGES/DRESSINGS) IMPLANT
DRSG OPSITE POSTOP 4X6 (GAUZE/BANDAGES/DRESSINGS) IMPLANT
DRSG OPSITE POSTOP 4X8 (GAUZE/BANDAGES/DRESSINGS) IMPLANT
ELECT PENCIL ROCKER SW 15FT (MISCELLANEOUS) ×1 IMPLANT
ELECT REM PT RETURN 15FT ADLT (MISCELLANEOUS) ×1 IMPLANT
ENDOLOOP SUT PDS II  0 18 (SUTURE)
ENDOLOOP SUT PDS II 0 18 (SUTURE) IMPLANT
EVACUATOR SILICONE 100CC (DRAIN) IMPLANT
GLOVE BIO SURGEON STRL SZ 6.5 (GLOVE) ×3 IMPLANT
GLOVE BIOGEL PI IND STRL 7.0 (GLOVE) ×2 IMPLANT
GLOVE INDICATOR 6.5 STRL GRN (GLOVE) ×1 IMPLANT
GOWN SRG XL LVL 4 BRTHBL STRL (GOWNS) ×1 IMPLANT
GOWN STRL NON-REIN XL LVL4 (GOWNS) ×1
GOWN STRL REUS W/ TWL XL LVL3 (GOWN DISPOSABLE) ×3 IMPLANT
GOWN STRL REUS W/TWL XL LVL3 (GOWN DISPOSABLE) ×3
GRASPER SUT TROCAR 14GX15 (MISCELLANEOUS) IMPLANT
HOLDER FOLEY CATH W/STRAP (MISCELLANEOUS) ×1 IMPLANT
IRRIG SUCT STRYKERFLOW 2 WTIP (MISCELLANEOUS) ×1
IRRIGATION SUCT STRKRFLW 2 WTP (MISCELLANEOUS) ×1 IMPLANT
KIT PROCEDURE DA VINCI SI (MISCELLANEOUS)
KIT PROCEDURE DVNC SI (MISCELLANEOUS) IMPLANT
KIT SIGMOIDOSCOPE (SET/KITS/TRAYS/PACK) IMPLANT
KIT TURNOVER KIT A (KITS) IMPLANT
NDL INSUFFLATION 14GA 120MM (NEEDLE) ×1 IMPLANT
NEEDLE INSUFFLATION 14GA 120MM (NEEDLE) ×1 IMPLANT
PACK CARDIOVASCULAR III (CUSTOM PROCEDURE TRAY) ×1 IMPLANT
PACK COLON (CUSTOM PROCEDURE TRAY) ×1 IMPLANT
PAD POSITIONING PINK XL (MISCELLANEOUS) ×1 IMPLANT
RELOAD STAPLE 60 3.5 BLU DVNC (STAPLE) IMPLANT
RELOAD STAPLE 60 4.3 GRN DVNC (STAPLE) IMPLANT
RELOAD STAPLER 3.5X60 BLU DVNC (STAPLE) IMPLANT
RELOAD STAPLER 4.3X60 GRN DVNC (STAPLE) ×1 IMPLANT
RETRACTOR WND ALEXIS 18 MED (MISCELLANEOUS) IMPLANT
RTRCTR WOUND ALEXIS 18CM MED (MISCELLANEOUS)
SCISSORS LAP 5X35 DISP (ENDOMECHANICALS) IMPLANT
SEAL CANN UNIV 5-8 DVNC XI (MISCELLANEOUS) ×3 IMPLANT
SEAL XI 5MM-8MM UNIVERSAL (MISCELLANEOUS) ×4
SEALER VESSEL DA VINCI XI (MISCELLANEOUS) ×1
SEALER VESSEL EXT DVNC XI (MISCELLANEOUS) ×1 IMPLANT
SOLUTION ELECTROLUBE (MISCELLANEOUS) ×1 IMPLANT
SPIKE FLUID TRANSFER (MISCELLANEOUS) IMPLANT
STAPLER 60 DA VINCI SURE FORM (STAPLE) ×1
STAPLER 60 SUREFORM DVNC (STAPLE) IMPLANT
STAPLER CANNULA SEAL DVNC XI (STAPLE) IMPLANT
STAPLER CANNULA SEAL XI (STAPLE) ×1
STAPLER ECHELON POWER CIR 29 (STAPLE) IMPLANT
STAPLER ECHELON POWER CIR 31 (STAPLE) IMPLANT
STAPLER RELOAD 3.5X60 BLU DVNC (STAPLE)
STAPLER RELOAD 3.5X60 BLUE (STAPLE)
STAPLER RELOAD 4.3X60 GREEN (STAPLE) ×1
STAPLER RELOAD 4.3X60 GRN DVNC (STAPLE) ×1
STOPCOCK 4 WAY LG BORE MALE ST (IV SETS) ×2 IMPLANT
SUT ETHILON 2 0 PS N (SUTURE) IMPLANT
SUT NOVA NAB GS-21 1 T12 (SUTURE) ×2 IMPLANT
SUT PROLENE 2 0 KS (SUTURE) IMPLANT
SUT SILK 2 0 (SUTURE) ×1
SUT SILK 2 0 SH CR/8 (SUTURE) IMPLANT
SUT SILK 2-0 18XBRD TIE 12 (SUTURE) ×1 IMPLANT
SUT SILK 3 0 (SUTURE)
SUT SILK 3 0 SH CR/8 (SUTURE) ×1 IMPLANT
SUT SILK 3-0 18XBRD TIE 12 (SUTURE) IMPLANT
SUT V-LOC BARB 180 2/0GR6 GS22 (SUTURE)
SUT VIC AB 2-0 SH 18 (SUTURE) IMPLANT
SUT VIC AB 2-0 SH 27 (SUTURE)
SUT VIC AB 2-0 SH 27X BRD (SUTURE) IMPLANT
SUT VIC AB 3-0 SH 18 (SUTURE) IMPLANT
SUT VIC AB 4-0 PS2 27 (SUTURE) ×2 IMPLANT
SUT VICRYL 0 UR6 27IN ABS (SUTURE) ×1 IMPLANT
SUTURE V-LC BRB 180 2/0GR6GS22 (SUTURE) IMPLANT
SYR 20ML ECCENTRIC (SYRINGE) ×1 IMPLANT
SYS LAPSCP GELPORT 120MM (MISCELLANEOUS)
SYS WOUND ALEXIS 18CM MED (MISCELLANEOUS) ×1
SYSTEM LAPSCP GELPORT 120MM (MISCELLANEOUS) IMPLANT
SYSTEM WOUND ALEXIS 18CM MED (MISCELLANEOUS) IMPLANT
TOWEL OR 17X26 10 PK STRL BLUE (TOWEL DISPOSABLE) IMPLANT
TOWEL OR NON WOVEN STRL DISP B (DISPOSABLE) ×1 IMPLANT
TRAY FOLEY MTR SLVR 16FR STAT (SET/KITS/TRAYS/PACK) ×1 IMPLANT
TROCAR ADV FIXATION 5X100MM (TROCAR) ×1 IMPLANT
TUBING CONNECTING 10 (TUBING) ×2 IMPLANT
TUBING INSUFFLATION 10FT LAP (TUBING) ×1 IMPLANT

## 2022-07-09 NOTE — Op Note (Signed)
07/09/2022  9:38 AM  PATIENT:  Sara Dorsey  52 y.o. female  Patient Care Team: Dickie La, MD as PCP - General (Family Medicine) Truitt Merle, MD as Consulting Physician (Oncology) Daryel November, MD as Consulting Physician (Gastroenterology) Leighton Ruff, MD as Consulting Physician (General Surgery) Truitt Merle, MD as Consulting Physician (Hematology)  PRE-OPERATIVE DIAGNOSIS:  COLON CANCER  POST-OPERATIVE DIAGNOSIS:  COLON CANCER  PROCEDURE:  XI ROBOTIC LAR  MYOMECTOMY  SURGEON:  Surgeon(s): Leighton Ruff, MD Michael Boston, MD  ASSISTANT: Dr Johney Maine   ANESTHESIA:   local and general  EBL: 50 Total I/O In: 1000 [I.V.:1000] Out: 350 [Urine:300; Blood:50]  Delay start of Pharmacological VTE agent (>24hrs) due to surgical blood loss or risk of bleeding:  no  DRAINS: none   SPECIMEN:  Source of Specimen:  Rectosigmoid, Pedunculated Uterine Fibroid  DISPOSITION OF SPECIMEN:  PATHOLOGY  COUNTS:  YES  PLAN OF CARE: Admit to inpatient   PATIENT DISPOSITION:  PACU - hemodynamically stable.  INDICATION:    52 y.o. F with rectosigmoid cancer.  I recommended resection:  The anatomy & physiology of the digestive tract was discussed.  The pathophysiology was discussed.  Natural history risks without surgery was discussed.   I worked to give an overview of the disease and the frequent need to have multispecialty involvement.  I feel the risks of no intervention will lead to serious problems that outweigh the operative risks; therefore, I recommended a partial colectomy to remove the pathology.  Laparoscopic & open techniques were discussed.   Risks such as bleeding, infection, abscess, leak, reoperation, possible ostomy, hernia, heart attack, death, and other risks were discussed.  I noted a good likelihood this will help address the problem.   Goals of post-operative recovery were discussed as well.    The patient expressed understanding & wished to proceed with  surgery.  OR FINDINGS:   Patient had   No obvious metastatic disease on visceral parietal peritoneum or liver.  The anastomosis rests 10 cm from the anal verge by rigid proctoscopy.  DESCRIPTION:   Informed consent was confirmed.  The patient underwent general anaesthesia without difficulty.  The patient was positioned appropriately.  VTE prevention in place.  The patient's abdomen was clipped, prepped, & draped in a sterile fashion.  Surgical timeout confirmed our plan.  The patient was positioned in reverse Trendelenburg.  Abdominal entry was gained using a Veress needle in the left upper quadrant.  Entry was clean.  I induced carbon dioxide insufflation.  An 61m robotic port was placed in the right upper quadrant.  Camera inspection revealed no injury.  Extra ports were carefully placed under direct laparoscopic visualization.  I laparoscopically reflected the greater omentum and the upper abdomen the small bowel in the upper abdomen. The patient was appropriately positioned and the robot was docked to the patient's left side.  Instruments were placed under direct visualization.   The patient had a large uterus with a pedunculated fibroid tumor.  This made it difficult to visualize our surgical field.  The fibroid was resected using the robotic vessel sealer.  This was sent to pathology as a separate specimen.  I mobilized the sigmoid colon off of the pelvic sidewall.  I scored the base of peritoneum of the right side of the mesentery of the left colon from the ligament of Treitz to the peritoneal reflection of the mid rectum.  The patient had tattoo noted in the distal sigmoid area with an obvious mass just  distal to the tattoo. I elevated the sigmoid mesentery and enetered into the retro-mesenteric plane. We were able to identify the left ureter and gonadal vessels. We kept those posterior within the retroperitoneum and elevated the left colon mesentery off that. I did isolated IMA pedicle but  did not ligate it yet.  I continued distally and got into the avascular plane posterior to the mesorectum. This allowed me to help mobilize the rectum as well by freeing the mesorectum off the sacrum.  I mobilized the peritoneal coverings towards the peritoneal reflection on both the right and left sides of the rectum.  I could see the right and left ureters and stayed away from them.    I skeletonized the inferior mesenteric artery pedicle.  I went down to its takeoff from the aorta.   I isolated the inferior mesenteric vein off of the ligament of Treitz just cephalad to that as well.  After confirming the left ureter was out of the way, I went ahead and ligated the inferior mesenteric artery pedicle with bipolar robotic vessel sealer ~2cm above its takeoff from the aorta.  We ensured hemostasis. I skeletonized the mesorectum at the junction at the proximal rectum using blunt dissection & bipolar robotic vessel sealer.  I mobilized the left colon in a lateral to medial fashion off the line of Toldt up towards the splenic flexure to ensure good mobilization of the left colon to reach into the pelvis.  I then skeletonized the mesorectum approximately 3 cm distal to the tumor using the robotic vessel sealer.  This was then transected with a green load 60 mm robotic stapler.  The mesentery of the sigmoid colon was divided up to the level of the proximal sigmoid.  The robot was then undocked.  The 12 mm suprapubic port was enlarged into a Pfannenstiel incision.  An Stanford wound protector was placed.  The colon was brought out through the wound and transected proximally over a pursestring device.  A 2-0 Prolene pursestring was placed and a 29 mm EEA anvil was tied tightly around this.  This was then placed back into the abdomen and the abdomen was reinsufflated.  An anastomosis was created from the rectum to the colon using a 29 mm EEA stapler.  There was no tension noted on the anastomosis.  There was no leak when  tested with insufflation under irrigation.  The abdomen was inspected.  There was no sign of active bleeding or injury.  The omentum was then brought down over the abdominal contents and all instruments were removed.  We then switched to clean gowns, gloves, instruments and drapes.  The Pfannenstiel peritoneum was closed using a running 0 Vicryl suture.  The fascia was closed using 2 running #1 Novafil sutures.  The subcutaneous tissue was reapproximated using a running 2-0 Vicryl suture and the skin was closed using a running 4-0 Vicryl subcuticular suture.  A sterile dressing was applied.  The remaining port sites were closed using interrupted 4-0 Vicryl suture and Dermabond.  The patient was then awakened from anesthesia and sent to the postanesthesia care unit in stable condition.  All counts were correct per operating room staff.  Rosario Adie, MD  Colorectal and [place] Surgery   An MD assistant was necessary for tissue manipulation, retraction and positioning due to the complexity of the case and hospital policies

## 2022-07-09 NOTE — Anesthesia Preprocedure Evaluation (Signed)
Anesthesia Evaluation  Patient identified by MRN, date of birth, ID band Patient awake    Reviewed: Allergy & Precautions, H&P , NPO status , Patient's Chart, lab work & pertinent test results  Airway Mallampati: II  TM Distance: >3 FB Neck ROM: Full    Dental no notable dental hx.    Pulmonary neg pulmonary ROS   Pulmonary exam normal breath sounds clear to auscultation       Cardiovascular hypertension, Pt. on medications Normal cardiovascular exam Rhythm:Regular Rate:Normal     Neuro/Psych negative neurological ROS  negative psych ROS   GI/Hepatic negative GI ROS, Neg liver ROS,,,  Endo/Other  negative endocrine ROS    Renal/GU negative Renal ROS  negative genitourinary   Musculoskeletal negative musculoskeletal ROS (+)    Abdominal   Peds negative pediatric ROS (+)  Hematology negative hematology ROS (+)   Anesthesia Other Findings   Reproductive/Obstetrics negative OB ROS                             Anesthesia Physical Anesthesia Plan  ASA: 2  Anesthesia Plan: General   Post-op Pain Management: Tylenol PO (pre-op)*   Induction: Intravenous  PONV Risk Score and Plan: 3 and Ondansetron, Dexamethasone, Midazolam and Treatment may vary due to age or medical condition  Airway Management Planned: Oral ETT  Additional Equipment:   Intra-op Plan:   Post-operative Plan: Extubation in OR  Informed Consent: I have reviewed the patients History and Physical, chart, labs and discussed the procedure including the risks, benefits and alternatives for the proposed anesthesia with the patient or authorized representative who has indicated his/her understanding and acceptance.     Dental advisory given  Plan Discussed with: CRNA and Surgeon  Anesthesia Plan Comments:        Anesthesia Quick Evaluation

## 2022-07-09 NOTE — Transfer of Care (Signed)
Immediate Anesthesia Transfer of Care Note  Patient: Sara Dorsey  Procedure(s) Performed: XI ROBOTIC LAR WITH MYOMECTOMY (Abdomen)  Patient Location: PACU  Anesthesia Type:General  Level of Consciousness: awake, alert , and oriented  Airway & Oxygen Therapy: Patient Spontanous Breathing and Patient connected to nasal cannula oxygen  Post-op Assessment: Report given to RN and Post -op Vital signs reviewed and stable  Post vital signs: Reviewed and stable  Last Vitals:  Vitals Value Taken Time  BP 149/74 07/09/22 0951  Temp    Pulse 59 07/09/22 0951  Resp 18 07/09/22 0951  SpO2 100 % 07/09/22 0951    Last Pain:  Vitals:   07/09/22 0543  TempSrc:   PainSc: 0-No pain      Patients Stated Pain Goal: 4 (02/17/47 1856)  Complications: No notable events documented.

## 2022-07-09 NOTE — Anesthesia Postprocedure Evaluation (Signed)
Anesthesia Post Note  Patient: Sara Dorsey  Procedure(s) Performed: XI ROBOTIC LAR WITH MYOMECTOMY (Abdomen)     Patient location during evaluation: PACU Anesthesia Type: General Level of consciousness: awake and alert Pain management: pain level controlled Vital Signs Assessment: post-procedure vital signs reviewed and stable Respiratory status: spontaneous breathing, nonlabored ventilation, respiratory function stable and patient connected to nasal cannula oxygen Cardiovascular status: blood pressure returned to baseline and stable Postop Assessment: no apparent nausea or vomiting Anesthetic complications: no  No notable events documented.  Last Vitals:  Vitals:   07/09/22 1015 07/09/22 1019  BP: 131/80   Pulse: (!) 51 (!) 51  Resp: 14 12  Temp:    SpO2: 100% 100%    Last Pain:  Vitals:   07/09/22 1019  TempSrc:   PainSc: 6                  Dominga Mcduffie S

## 2022-07-09 NOTE — H&P (Signed)
REFERRING PHYSICIAN:  Patrice Paradise*   PROVIDER:  Monico Blitz, MD   MRN: [id number] DOB: 1971-06-09    Subjective       History of Present Illness: Sara Dorsey is a 52 y.o. female who is seen today as an office consultation at the request of Dr. Candis Schatz for evaluation of No chief complaint on file. .  52 year old female who underwent a screening colonoscopy on May 25, 2022.  She was noted to have a partially obstructing mass in the rectosigmoid area approximately 10 cm from the anal verge.  This was biopsied and tattooed 3 to 4 cm proximally.  Several other polyps were removed as well.  CT scan showed no signs of metastatic disease with a mass noted in the distal sigmoid colon.  Biopsy showed moderately invasive adenocarcinoma.  CEA was normal     Review of Systems: A complete review of systems was obtained from the patient.  I have reviewed this information and discussed as appropriate with the patient.  See HPI as well for other ROS.       Medical History:     Past Medical History:  Diagnosis Date   Hypertension        There is no problem list on file for this patient.     History reviewed. No pertinent surgical history.    No Known Allergies         Current Outpatient Medications on File Prior to Visit  Medication Sig Dispense Refill   amLODIPine (NORVASC) 2.5 MG tablet Take 2.5 mg by mouth at bedtime        No current facility-administered medications on file prior to visit.      History reviewed. No pertinent family history.    Social History       Tobacco Use  Smoking Status Never  Smokeless Tobacco Never      Social History        Socioeconomic History   Marital status: Married  Tobacco Use   Smoking status: Never   Smokeless tobacco: Never  Vaping Use   Vaping Use: Never used  Substance and Sexual Activity   Alcohol use: Never   Drug use: Never      Objective:      BP 128/79   Pulse 77   Temp 98.8  F (37.1 C) (Oral)   Resp 16   Ht '5\' 2"'$  (1.575 m)   Wt 81.6 kg   SpO2 99%   BMI 32.92 kg/m      Exam Gen: NAD Abd: soft       Labs, Imaging and Diagnostic Testing: CT scan report and images personally reviewed.  Colonoscopy report reviewed   Assessment and Plan:  Diagnoses and all orders for this visit:   Cancer of sigmoid colon (CMS-HCC)     52 year old female with rectal bleeding who underwent colonoscopy and was noted to have a rectosigmoid mass.  Biopsy showed adenocarcinoma.  CT scan shows no signs of metastatic disease.  The mass is tattooed approximately 3 to 4 cm proximally.  Patient has no past surgical history except for tubal ligation.  I have recommended robotic assisted partial colectomy.  This mass appears to be above the area of the rectosigmoid on CT scan and I do not think any further imaging or treatment is needed due to concern for this being a rectal cancer .     The surgery and anatomy were described to the patient as well as the risks  of surgery and the possible complications.  These include: Bleeding, deep abdominal infections and possible wound complications such as hernia and infection, damage to adjacent structures, leak of surgical connections, which can lead to other surgeries and possibly an ostomy, possible need for other procedures, such as abscess drains in radiology, possible prolonged hospital stay, possible diarrhea from removal of part of the colon, possible constipation from narcotics, possible bowel, bladder or sexual dysfunction if having rectal surgery, prolonged fatigue/weakness or appetite loss, possible early recurrence of of disease, possible complications of their medical problems such as heart disease or arrhythmias or lung problems, death (less than 1%). I believe the patient understands and wishes to proceed with the surgery.  No follow-ups on file.     Rosario Adie, MD Colon and Rectal Surgery [facility]

## 2022-07-09 NOTE — Anesthesia Procedure Notes (Signed)
Procedure Name: Intubation Date/Time: 07/09/2022 7:32 AM  Performed by: Laquan Beier D, CRNAPre-anesthesia Checklist: Patient identified, Emergency Drugs available, Suction available and Patient being monitored Patient Re-evaluated:Patient Re-evaluated prior to induction Oxygen Delivery Method: Circle system utilized Preoxygenation: Pre-oxygenation with 100% oxygen Induction Type: IV induction Ventilation: Mask ventilation without difficulty Laryngoscope Size: Mac and 3 Grade View: Grade I Tube type: Oral Tube size: 7.0 mm Number of attempts: 1 Airway Equipment and Method: Stylet and Oral airway Placement Confirmation: ETT inserted through vocal cords under direct vision, positive ETCO2 and breath sounds checked- equal and bilateral Secured at: 21 cm Tube secured with: Tape Dental Injury: Teeth and Oropharynx as per pre-operative assessment

## 2022-07-10 LAB — BASIC METABOLIC PANEL
Anion gap: 7 (ref 5–15)
BUN: 7 mg/dL (ref 6–20)
CO2: 25 mmol/L (ref 22–32)
Calcium: 8.8 mg/dL — ABNORMAL LOW (ref 8.9–10.3)
Chloride: 107 mmol/L (ref 98–111)
Creatinine, Ser: 0.8 mg/dL (ref 0.44–1.00)
GFR, Estimated: >60 mL/min (ref >60)
Glucose, Bld: 102 mg/dL — ABNORMAL HIGH (ref 70–99)
Potassium: 3.4 mmol/L — ABNORMAL LOW (ref 3.5–5.1)
Sodium: 139 mmol/L (ref 135–145)

## 2022-07-10 LAB — CBC
HCT: 31.5 % — ABNORMAL LOW (ref 36.0–46.0)
Hemoglobin: 10.2 g/dL — ABNORMAL LOW (ref 12.0–15.0)
MCH: 28 pg (ref 26.0–34.0)
MCHC: 32.4 g/dL (ref 30.0–36.0)
MCV: 86.5 fL (ref 80.0–100.0)
Platelets: 247 10*3/uL (ref 150–400)
RBC: 3.64 MIL/uL — ABNORMAL LOW (ref 3.87–5.11)
RDW: 14.4 % (ref 11.5–15.5)
WBC: 14.1 10*3/uL — ABNORMAL HIGH (ref 4.0–10.5)
nRBC: 0 % (ref 0.0–0.2)

## 2022-07-10 NOTE — Progress Notes (Signed)
1 Day Post-Op Robotic LAR Subjective: Feeling well.  Ambulating.  Having BM's.  Tolerating clears  Objective: Vital signs in last 24 hours: Temp:  [97.6 F (36.4 C)-98.7 F (37.1 C)] 98.6 F (37 C) (01/05 0503) Pulse Rate:  [47-80] 59 (01/05 0503) Resp:  [12-18] 18 (01/05 0503) BP: (108-152)/(61-81) 108/66 (01/05 0503) SpO2:  [98 %-100 %] 99 % (01/05 0503)   Intake/Output from previous day: 01/04 0701 - 01/05 0700 In: 3278 [P.O.:720; I.V.:2458; IV Piggyback:100] Out: 3400 [Urine:3350; Blood:50] Intake/Output this shift: No intake/output data recorded.   General appearance: alert and cooperative Abd: soft, nondistended Incision: no significant drainage  Lab Results:  Recent Labs    07/10/22 0446  WBC 14.1*  HGB 10.2*  HCT 31.5*  PLT 247   BMET Recent Labs    07/10/22 0446  NA 139  K 3.4*  CL 107  CO2 25  GLUCOSE 102*  BUN 7  CREATININE 0.80  CALCIUM 8.8*   PT/INR No results for input(s): "LABPROT", "INR" in the last 72 hours. ABG No results for input(s): "PHART", "HCO3" in the last 72 hours.  Invalid input(s): "PCO2", "PO2"  MEDS, Scheduled  acetaminophen  1,000 mg Oral Q6H   alvimopan  12 mg Oral BID   enoxaparin (LOVENOX) injection  40 mg Subcutaneous Q24H   feeding supplement  237 mL Oral BID BM   saccharomyces boulardii  250 mg Oral BID    Studies/Results: No results found.  Assessment: s/p Procedure(s): XI ROBOTIC LAR WITH MYOMECTOMY Patient Active Problem List   Diagnosis Date Noted   Colon cancer ([place]) 07/09/2022   Cancer of sigmoid colon ([place]) 06/03/2022   Rectal bleeding 10/23/2021   Perimenopausal symptoms 10/23/2021   Essential hypertension, benign 12/18/2015   Well adult exam 09/24/2010   Allergic rhinitis 09/24/2010    Expected post op course  Plan: d/c foley Advance diet as tolerated SL IVFs   LOS: 1 day     .Rosario Adie, MD [facility], [place]    07/10/2022 7:46 AM

## 2022-07-10 NOTE — TOC CM/SW Note (Signed)
Transition of Care [facility]) Screening Note  Patient Details  Name: Sara Dorsey Date of Birth: 1970-12-07  Transition of Care [facility]) CM/SW Contact:    Sherie Don, LCSW Phone Number: 07/10/2022, 8:55 AM  Transition of Care Department [facility]) has reviewed patient and no TOC needs have been identified at this time. We will continue to monitor patient advancement through interdisciplinary progression rounds. If new patient transition needs arise, please place a TOC consult.

## 2022-07-10 NOTE — Progress Notes (Signed)
Pharmacy Brief Note - Alvimopan (Entereg)  The standing order set for alvimopan (Entereg) now includes an automatic order to discontinue the drug after the patient has had a bowel movement.  The change was approved by the [place] and the Medical Executive Committee.    This patient has had a bowel movement documented by nursing.  Therefore, alvimopan has been discontinued.  If there are questions, please contact the pharmacy at [phone].  Thank you   Royetta Asal, PharmD, BCPS 07/10/2022 12:55 PM

## 2022-07-11 LAB — BASIC METABOLIC PANEL
Anion gap: 8 (ref 5–15)
BUN: 11 mg/dL (ref 6–20)
CO2: 26 mmol/L (ref 22–32)
Calcium: 8.5 mg/dL — ABNORMAL LOW (ref 8.9–10.3)
Chloride: 107 mmol/L (ref 98–111)
Creatinine, Ser: 0.78 mg/dL (ref 0.44–1.00)
GFR, Estimated: >60 mL/min (ref >60)
Glucose, Bld: 101 mg/dL — ABNORMAL HIGH (ref 70–99)
Potassium: 3.5 mmol/L (ref 3.5–5.1)
Sodium: 141 mmol/L (ref 135–145)

## 2022-07-11 LAB — CBC
HCT: 32.6 % — ABNORMAL LOW (ref 36.0–46.0)
Hemoglobin: 10.3 g/dL — ABNORMAL LOW (ref 12.0–15.0)
MCH: 27.7 pg (ref 26.0–34.0)
MCHC: 31.6 g/dL (ref 30.0–36.0)
MCV: 87.6 fL (ref 80.0–100.0)
Platelets: 259 10*3/uL (ref 150–400)
RBC: 3.72 MIL/uL — ABNORMAL LOW (ref 3.87–5.11)
RDW: 14.6 % (ref 11.5–15.5)
WBC: 9.1 10*3/uL (ref 4.0–10.5)
nRBC: 0 % (ref 0.0–0.2)

## 2022-07-11 MED ORDER — TRAMADOL HCL 50 MG PO TABS: 50.0000 mg | ORAL_TABLET | Freq: Four times a day (QID) | ORAL | 0 refills | Status: DC | PRN

## 2022-07-11 NOTE — Discharge Summary (Signed)
Physician Discharge Summary  Patient ID: Sara Dorsey MRN: [id number] DOB/AGE: October 31, 1970 52 y.o.  Admit date: 07/09/2022 Discharge date: 07/11/2022  Admission Diagnoses: Colon cancer  Discharge Diagnoses:  Principal Problem:   Colon cancer [facility])   Discharged Condition: good  Hospital Course: Patient was admitted to the med surg floor after surgery.  Diet was advanced as tolerated.  Patient began to have bowel function on postop day 1.  By postop day 2, she was tolerating a solid diet and pain was controlled with oral medications.  She was urinating without difficulty and ambulating without assistance.  Patient was felt to be in stable condition for discharge to home.   Consults: None  Significant Diagnostic Studies: labs: cbc, bmet  Treatments: IV hydration, analgesia: acetaminophen and Dilaudid, and surgery: Robotic LAR  Discharge Exam: Blood pressure 125/82, pulse (!) 56, temperature 98.4 F (36.9 C), temperature source Oral, resp. rate 16, height '5\' 2"'$  (1.575 m), weight 81.2 kg, SpO2 99 %. General appearance: alert and cooperative GI: soft, non-distended Incision/Wound: clean, dry, intact  Disposition: Discharge disposition: 01-Home or Self Care        Allergies as of 07/11/2022       Reactions   Lisinopril Nausea And Vomiting, Other (See Comments), Cough   Other: abdominal cramping        Medication List     STOP taking these medications    metroNIDAZOLE 500 MG tablet Commonly known as: FLAGYL       TAKE these medications    amLODipine 2.5 MG tablet Commonly known as: NORVASC Take 1 tablet (2.5 mg total) by mouth at bedtime.   fluticasone 50 MCG/ACT nasal spray Commonly known as: FLONASE Place 2 sprays into both nostrils daily. What changed:  when to take this reasons to take this   traMADol 50 MG tablet Commonly known as: ULTRAM Take 1-2 tablets (50-100 mg total) by mouth every 6 (six) hours as needed.        Follow-up Information      Leighton Ruff, MD Follow up.   Specialties: General Surgery, Colon and Rectal Surgery Why: as scheduled Contact information: [place] [address] [phone]                 Signed: Rosario Adie 02/04/7077, 9:47 AM

## 2022-07-11 NOTE — Discharge Instructions (Signed)
SURGERY: POST OP INSTRUCTIONS (Surgery for small bowel obstruction, colon resection, etc)   ######################################################################  EAT Gradually transition to a high fiber diet with a fiber supplement over the next few days after discharge  WALK Walk an hour a day.  Control your pain to do that.    CONTROL PAIN Control pain so that you can walk, sleep, tolerate sneezing/coughing, go up/down stairs.  HAVE A BOWEL MOVEMENT DAILY Keep your bowels regular to avoid problems.  OK to try a laxative to override constipation.  OK to use an antidairrheal to slow down diarrhea.  Call if not better after 2 tries  CALL IF YOU HAVE PROBLEMS/CONCERNS Call if you are still struggling despite following these instructions. Call if you have concerns not answered by these instructions  ######################################################################   DIET Follow a light diet the first few days at home.  Start with a bland diet such as soups, liquids, starchy foods, low fat foods, etc.  If you feel full, bloated, or constipated, stay on a ful liquid or pureed/blenderized diet for a few days until you feel better and no longer constipated. Be sure to drink plenty of fluids every day to avoid getting dehydrated (feeling dizzy, not urinating, etc.). Gradually add a fiber supplement to your diet over the next week.  Gradually get back to a regular solid diet.  Avoid fast food or heavy meals the first week as you are more likely to get nauseated. It is expected for your digestive tract to need a few months to get back to normal.  It is common for your bowel movements and stools to be irregular.  You will have occasional bloating and cramping that should eventually fade away.  Until you are eating solid food normally, off all pain medications, and back to regular activities; your bowels will not be normal. Focus on eating a low-fat, high fiber diet the rest of your life  (See Getting to Good Bowel Health, below).  CARE of your INCISION or WOUND  It is good for closed incisions and even open wounds to be washed every day.  Shower every day.  Short baths are fine.  Wash the incisions and wounds clean with soap & water.    You may leave closed incisions open to air if it is dry.   You may cover the incision with clean gauze & replace it after your daily shower for comfort.  STAPLES: You have skin staples.  Leave them in place & set up an appointment for them to be removed by a surgery office nurse ~10 days after surgery. = 1st week of January 2024    ACTIVITIES as tolerated Start light daily activities --- self-care, walking, climbing stairs-- beginning the day after surgery.  Gradually increase activities as tolerated.  Control your pain to be active.  Stop when you are tired.  Ideally, walk several times a day, eventually an hour a day.   Most people are back to most day-to-day activities in a few weeks.  It takes 4-8 weeks to get back to unrestricted, intense activity. If you can walk 30 minutes without difficulty, it is safe to try more intense activity such as jogging, treadmill, bicycling, low-impact aerobics, swimming, etc. Save the most intensive and strenuous activity for last (Usually 4-8 weeks after surgery) such as sit-ups, heavy lifting, contact sports, etc.  Refrain from any intense heavy lifting or straining until you are off narcotics for pain control.  You will have off days, but things should improve   week-by-week. DO NOT PUSH THROUGH PAIN.  Let pain be your guide: If it hurts to do something, don't do it.  Pain is your body warning you to avoid that activity for another week until the pain goes down. You may drive when you are no longer taking narcotic prescription pain medication, you can comfortably wear a seatbelt, and you can safely make sudden turns/stops to protect yourself without hesitating due to pain. You may have sexual intercourse when it  is comfortable. If it hurts to do something, stop.  MEDICATIONS Take your usually prescribed home medications unless otherwise directed.   Blood thinners:  Usually you can restart any strong blood thinners after the second postoperative day.  It is OK to take aspirin right away.     If you are on strong blood thinners (warfarin/Coumadin, Plavix, Xerelto, Eliquis, Pradaxa, etc), discuss with your surgeon, medicine PCP, and/or cardiologist for instructions on when to restart the blood thinner & if blood monitoring is needed (PT/INR blood check, etc).     PAIN CONTROL Pain after surgery or related to activity is often due to strain/injury to muscle, tendon, nerves and/or incisions.  This pain is usually short-term and will improve in a few months.  To help speed the process of healing and to get back to regular activity more quickly, DO THE FOLLOWING THINGS TOGETHER: Increase activity gradually.  DO NOT PUSH THROUGH PAIN Use Ice and/or Heat Try Gentle Massage and/or Stretching Take over the counter pain medication Take Narcotic prescription pain medication for more severe pain  Good pain control = faster recovery.  It is better to take more medicine to be more active than to stay in bed all day to avoid medications.  Increase activity gradually Avoid heavy lifting at first, then increase to lifting as tolerated over the next 6 weeks. Do not "push through" the pain.  Listen to your body and avoid positions and maneuvers than reproduce the pain.  Wait a few days before trying something more intense Walking an hour a day is encouraged to help your body recover faster and more safely.  Start slowly and stop when getting sore.  If you can walk 30 minutes without stopping or pain, you can try more intense activity (running, jogging, aerobics, cycling, swimming, treadmill, sex, sports, weightlifting, etc.) Remember: If it hurts to do it, then don't do it! Use Ice and/or Heat You will have swelling and  bruising around the incisions.  This will take several weeks to resolve. Ice packs or heating pads (6-8 times a day, 30-60 minutes at a time) will help sooth soreness & bruising. Some people prefer to use ice alone, heat alone, or alternate between ice & heat.  Experiment and see what works best for you.  Consider trying ice for the first few days to help decrease swelling and bruising; then, switch to heat to help relax sore spots and speed recovery. Shower every day.  Short baths are fine.  It feels good!  Keep the incisions and wounds clean with soap & water.   Try Gentle Massage and/or Stretching Massage at the area of pain many times a day Stop if you feel pain - do not overdo it Take over the counter pain medication This helps the muscle and nerve tissues become less irritable and calm down faster Choose ONE of the following over-the-counter anti-inflammatory medications: Acetaminophen 500mg tabs (Tylenol) 1-2 pills with every meal and just before bedtime (avoid if you have liver problems or if you have   acetaminophen in you narcotic prescription) Naproxen 220mg tabs (ex. Aleve, Naprosyn) 1-2 pills twice a day (avoid if you have kidney, stomach, IBD, or bleeding problems) Ibuprofen 200mg tabs (ex. Advil, Motrin) 3-4 pills with every meal and just before bedtime (avoid if you have kidney, stomach, IBD, or bleeding problems) Take with food/snack several times a day as directed for at least 2 weeks to help keep pain / soreness down & more manageable. Take Narcotic prescription pain medication for more severe pain A prescription for strong pain control is often given to you upon discharge (for example: oxycodone/Percocet, hydrocodone/Norco/Vicodin, or tramadol/Ultram) Take your pain medication as prescribed. Be mindful that most narcotic prescriptions contain Tylenol (acetaminophen) as well - avoid taking too much Tylenol. If you are having problems/concerns with the prescription medicine (does  not control pain, nausea, vomiting, rash, itching, etc.), please call us [phone] to see if we need to switch you to a different pain medicine that will work better for you and/or control your side effects better. If you need a refill on your pain medication, you must call the office before 4 pm and on weekdays only.  By federal law, prescriptions for narcotics cannot be called into a pharmacy.  They must be filled out on paper & picked up from our office by the patient or authorized caretaker.  Prescriptions cannot be filled after 4 pm nor on weekends.    WHEN TO CALL US [phone] Severe uncontrolled or worsening pain  Fever over 101 F (38.5 C) Concerns with the incision: Worsening pain, redness, rash/hives, swelling, bleeding, or drainage Reactions / problems with new medications (itching, rash, hives, nausea, etc.) Nausea and/or vomiting Difficulty urinating Difficulty breathing Worsening fatigue, dizziness, lightheadedness, blurred vision Other concerns If you are not getting better after two weeks or are noticing you are getting worse, contact our office [phone] for further advice.  We may need to adjust your medications, re-evaluate you in the office, send you to the emergency room, or see what other things we can do to help. The clinic staff is available to answer your questions during regular business hours (8:30am-5pm).  Please don't hesitate to call and ask to speak to one of our nurses for clinical concerns.    A surgeon from [facility] is always on call at the hospitals 24 hours/day If you have a medical emergency, go to the nearest emergency room or call 911.  FOLLOW UP in our office One the day of your discharge from the hospital (or the next business weekday), please call [facility] to set up or confirm an appointment to see your surgeon in the office for a follow-up appointment.  Usually it is 2-3 weeks after your surgery.   If you  have skin staples at your incision(s), let the office know so we can set up a time in the office for the nurse to remove them (usually around 10 days after surgery). Make sure that you call for appointments the day of discharge (or the next business weekday) from the hospital to ensure a convenient appointment time. IF YOU HAVE DISABILITY OR FAMILY LEAVE FORMS, BRING THEM TO THE OFFICE FOR PROCESSING.  DO NOT GIVE THEM TO YOUR DOCTOR.  [facility] [address], [address], [place], [place]  [address] ? [phone] - Main [phone] - Toll Free,  [phone] - Fax [email or web address]    GETTING TO GOOD BOWEL HEALTH. It is expected for your digestive tract to   need a few months to get back to normal.  It is common for your bowel movements and stools to be irregular.  You will have occasional bloating and cramping that should eventually fade away.  Until you are eating solid food normally, off all pain medications, and back to regular activities; your bowels will not be normal.   Avoiding constipation The goal: ONE SOFT BOWEL MOVEMENT A DAY!    Drink plenty of fluids.  Choose water first. TAKE A FIBER SUPPLEMENT EVERY DAY THE REST OF YOUR LIFE During your first week back home, gradually add back a fiber supplement every day Experiment which form you can tolerate.   There are many forms such as powders, tablets, wafers, gummies, etc Psyllium bran (Metamucil), methylcellulose (Citrucel), Miralax or Glycolax, Benefiber, Flax Seed.  Adjust the dose week-by-week (1/2 dose/day to 6 doses a day) until you are moving your bowels 1-2 times a day.  Cut back the dose or try a different fiber product if it is giving you problems such as diarrhea or bloating. Sometimes a laxative is needed to help jump-start bowels if constipated until the fiber supplement can help regulate your bowels.  If you are tolerating eating & you are farting, it is okay to try a gentle  laxative such as double dose MiraLax, prune juice, or Milk of Magnesia.  Avoid using laxatives too often. Stool softeners can sometimes help counteract the constipating effects of narcotic pain medicines.  It can also cause diarrhea, so avoid using for too long. If you are still constipated despite taking fiber daily, eating solids, and a few doses of laxatives, call our office. Controlling diarrhea Try drinking liquids and eating bland foods for a few days to avoid stressing your intestines further. Avoid dairy products (especially milk & ice cream) for a short time.  The intestines often can lose the ability to digest lactose when stressed. Avoid foods that cause gassiness or bloating.  Typical foods include beans and other legumes, cabbage, broccoli, and dairy foods.  Avoid greasy, spicy, fast foods.  Every person has some sensitivity to other foods, so listen to your body and avoid those foods that trigger problems for you. Probiotics (such as active yogurt, Align, etc) may help repopulate the intestines and colon with normal bacteria and calm down a sensitive digestive tract Adding a fiber supplement gradually can help thicken stools by absorbing excess fluid and retrain the intestines to act more normally.  Slowly increase the dose over a few weeks.  Too much fiber too soon can backfire and cause cramping & bloating. It is okay to try and slow down diarrhea with a few doses of antidiarrheal medicines.   Bismuth subsalicylate (ex. Kayopectate, Pepto Bismol) for a few doses can help control diarrhea.  Avoid if pregnant.   Loperamide (Imodium) can slow down diarrhea.  Start with one tablet (2mg) first.  Avoid if you are having fevers or severe pain.  ILEOSTOMY PATIENTS WILL HAVE CHRONIC DIARRHEA since their colon is not in use.    Drink plenty of liquids.  You will need to drink even more glasses of water/liquid a day to avoid getting dehydrated. Record output from your ileostomy.  Expect to empty  the bag every 3-4 hours at first.  Most people with a permanent ileostomy empty their bag 4-6 times at the least.   Use antidiarrheal medicine (especially Imodium) several times a day to avoid getting dehydrated.  Start with a dose at bedtime & breakfast.  Adjust up or   down as needed.  Increase antidiarrheal medications as directed to avoid emptying the bag more than 8 times a day (every 3 hours). Work with your wound ostomy nurse to learn care for your ostomy.  See ostomy care instructions. TROUBLESHOOTING IRREGULAR BOWELS 1) Start with a soft & bland diet. No spicy, greasy, or fried foods.  2) Avoid gluten/wheat or dairy products from diet to see if symptoms improve. 3) Miralax 17gm or flax seed mixed in 8oz. water or juice-daily. May use 2-4 times a day as needed. 4) Gas-X, Phazyme, etc. as needed for gas & bloating.  5) Prilosec (omeprazole) over-the-counter as needed 6)  Consider probiotics (Align, Activa, etc) to help calm the bowels down  Call your doctor if you are getting worse or not getting better.  Sometimes further testing (cultures, endoscopy, X-ray studies, CT scans, bloodwork, etc.) may be needed to help diagnose and treat the cause of the diarrhea. [facility] [address], [address], [place], [place]  [address] [phone] - Main.    [phone]  - Toll Free.   [phone] - Fax [email or web address]   ###############################   #######################################################  Ostomy Support Information  You've heard that people get along just fine with only one of their eyes, or one of their lungs, or one of their kidneys. But you also know that you have only one intestine and only one bladder, and that leaves you feeling awfully empty, both physically and emotionally: You think no other people go around without part of their intestine with the ends of their intestines sticking out through their abdominal walls.    YOU ARE NOT ALONE.  There are nearly three quarters of a million people in the [place] who have an ostomy; people who have had surgery to remove all or part of their colons or bladders.   There is even a national association, the [employer] with over 350 local affiliated support groups that are organized by volunteers who provide peer support and counseling. [employer] has a toll free telephone num-ber, 800-826-0826 and an educational, interactive website, www.ostomy.org   An ostomy is an opening in the belly (abdominal wall) made by surgery. Ostomates are people who have had this procedure. The opening (stoma) allows the kidney or bowel to grdischarge waste. An external pouch covers the stoma to collect waste. Pouches are are a simple bag and are odor free. Different companies have disposable or reusable pouches to fit one's lifestyle. An ostomy can either be temporary or permanent.   THERE ARE THREE MAIN TYPES OF OSTOMIES Colostomy. A colostomy is a surgically created opening in the large intestine (colon). Ileostomy. An ileostomy is a surgically created opening in the small intestine. Urostomy. A urostomy is a surgically created opening to divert urine away from the bladder.  OSTOMY Care  The following guidelines will make care of your colostomy easier. Keep this information close by for quick reference.  Helpful DIET hints Eat a well-balanced diet including vegetables and fresh fruits. Eat on a regular schedule.  Drink at least 6 to 8 glasses of fluids daily. Eat slowly in a relaxed atmosphere. Chew your food thoroughly. Avoid chewing gum, smoking, and drinking from a straw. This will help decrease the amount of air you swallow, which may help reduce gas. Eating yogurt or drinking buttermilk may help reduce gas.  To control gas at night, do not eat after 8 p.m. This will give your bowel time to quiet down before you go   to bed.  If gas is a problem, you can purchase  Beano. Sprinkle Beano on the first bite of food before eating to reduce gas. It has no flavor and should not change the taste of your food. You can buy Beano over the counter at your local drugstore.  Foods like fish, onions, garlic, broccoli, asparagus, and cabbage produce odor. Although your pouch is odor-proof, if you eat these foods you may notice a stronger odor when emptying your pouch. If this is a concern, you may want to limit these foods in your diet.  If you have an ileostomy, you will have chronic diarrhea & need to drink more liquids to avoid getting dehydrated.  Consider antidiarrheal medicine like imodium (loperamide) or Lomotil to help slow down bowel movements / diarrhea into your ileostomy bag.  GETTING TO GOOD BOWEL HEALTH WITH AN ILEOSTOMY    With the colon bypassed & not in use, you will have small bowel diarrhea.   It is important to thicken & slow your bowel movements down.   The goal: 4-6 small BOWEL MOVEMENTS A DAY It is important to drink plenty of liquids to avoid getting dehydrated  CONTROLLING ILEOSTOMY DIARRHEA  TAKE A FIBER SUPPLEMENT (FiberCon or Benefiner soluble fiber) twice a day - to thicken stools by absorbing excess fluid and retrain the intestines to act more normally.  Slowly increase the dose over a few weeks.  Too much fiber too soon can backfire and cause cramping & bloating.  TAKE AN IRON SUPPLEMENT twice a day to naturally constipate your bowels.  Usually ferrous sulfate 325mg twice a day)  TAKE ANTI-DIARRHEAL MEDICINES: Loperamide (Imodium) can slow down diarrhea.  Start with two tablets (= 4mg) first and then try one tablet every 6 hours.  Can go up to 2 pills four times day (8 pills of 2mg max) Avoid if you are having fevers or severe pain.  If you are not better or start feeling worse, stop all medicines and call your doctor for advice LoMotil (Diphenoxylate / Atropine) is another medicine that can constipate & slow down bowel moevements Pepto  Bismol (bismuth) can gently thicken bowels as well  If diarrhea is worse,: drink plenty of liquids and try simpler foods for a few days to avoid stressing your intestines further. Avoid dairy products (especially milk & ice cream) for a short time.  The intestines often can lose the ability to digest lactose when stressed. Avoid foods that cause gassiness or bloating.  Typical foods include beans and other legumes, cabbage, broccoli, and dairy foods.  Every person has some sensitivity to other foods, so listen to our body and avoid those foods that trigger problems for you.Call your doctor if you are getting worse or not better.  Sometimes further testing (cultures, endoscopy, X-ray studies, bloodwork, etc) may be needed to help diagnose and treat the cause of the diarrhea. Take extra anti-diarrheal medicines (maximum is 8 pills of 2mg loperamide a day)   Tips for POUCHING an OSTOMY   Changing Your Pouch The best time to change your pouch is in the morning, before eating or drinking anything. Your stoma can function at any time, but it will function more after eating or drinking.   Applying the pouching system  Place all your equipment close at hand before removing your pouch.  Wash your hands.  Stand or sit in front of a mirror. Use the position that works best for you. Remember that you must keep the skin around the stoma   wrinkle-free for a good seal.  Gently remove the used pouch (1-piece system) or the pouch and old wafer (2-piece system). Empty the pouch into the toilet. Save the closure clip to use again.  Wash the stoma itself and the skin around the stoma. Your stoma may bleed a little when being washed. This is normal. Rinse and pat dry. You may use a wash cloth or soft paper towels (like Bounty), mild soap (like Dial, Safeguard, or Ivory), and water. Avoid soaps that contain perfumes or lotions.  For a new pouch (1-piece system) or a new wafer (2-piece system), measure your  stoma using the stoma guide in each box of supplies.  Trace the shape of your stoma onto the back of the new pouch or the back of the new wafer. Cut out the opening. Remove the paper backing and set it aside.  Optional: Apply a skin barrier powder to surrounding skin if it is irritated (bare or weeping), and dust off the excess. Optional: Apply a skin-prep wipe (such as Skin Prep or All-Kare) to the skin around the stoma, and let it dry. Do not apply this solution if the skin is irritated (red, tender, or broken) or if you have shaved around the stoma. Optional: Apply a skin barrier paste (such as Stomahesive, Coloplast, or Premium) around the opening cut in the back of the pouch or wafer. Allow it to dry for 30 to 60 seconds.  Hold the pouch (1-piece system) or wafer (2-piece system) with the sticky side toward your body. Make sure the skin around the stoma is wrinkle-free. Center the opening on the stoma, then press firmly to your abdomen (Fig. 4). Look in the mirror to check if you are placing the pouch, or wafer, in the right position. For a 2-piece system, snap the pouch onto the wafer. Make sure it snaps into place securely.  Place your hand over the stoma and the pouch or wafer for about 30 seconds. The heat from your hand can help the pouch or wafer stick to your skin.  Add deodorant (such as Super Banish or Nullo) to your pouch. Other options include food extracts such as vanilla oil and peppermint extract. Add about 10 drops of the deodorant to the pouch. Then apply the closure clamp. Note: Do not use toxic  chemicals or commercial cleaning agents in your pouch. These substances may harm the stoma.  Optional: For extra seal, apply tape to all 4 sides around the pouch or wafer, as if you were framing a picture. You may use any brand of medical adhesive tape. Change your pouch every 5 to 7 days. Change it immediately if a leak occurs.  Wash your hands afterwards.  If you are wearing a  2-piece system, you may use 2 new pouches per week and alternate them. Rinse the pouch with mild soap and warm water and hang it to dry for the next day. Apply the fresh pouch. Alternate the 2 pouches like this for a week. After a week, change the wafer and begin with 2 new pouches. Place the old pouches in a plastic bag, and put them in the trash.   LIVING WITH AN OSTOMY  Emptying Your Pouch Empty your pouch when it is one-third full (of urine, stool, and/or gas). If you wait until your pouch is fuller than this, it will be more difficult to empty and more noticeable. When you empty your pouch, either put toilet paper in the toilet bowl first, or flush the   toilet while you empty the pouch. This will reduce splashing. You can empty the pouch between your legs or to one side while sitting, or while standing or stooping. If you have a 2-piece system, you can snap off the pouch to empty it. Remember that your stoma may function during this time. If you wish to rinse your pouch after you empty it, a turkey baster can be helpful. When using a baster, squirt water up into the pouch through the opening at the bottom. With a 2-piece system, you can snap off the pouch to rinse it. After rinsing  your pouch, empty it into the toilet. When rinsing your pouch at home, put a few granules of Dreft soap in the rinse water. This helps lubricate and freshen your pouch. The inside of your pouch can be sprayed with non-stick cooking oil (Pam spray). This may help reduce stool sticking to the inside of the pouch.  Bathing You may shower or bathe with your pouch on or off. Remember that your stoma may function during this time.  The materials you use to wash your stoma and the skin around it should be clean, but they do not need to be sterile.  Wearing Your Pouch During hot weather, or if you perspire a lot in general, wear a cover over your pouch. This may prevent a rash on your skin under the pouch. Pouch covers are  sold at ostomy supply stores. Wear the pouch inside your underwear for better support. Watch your weight. Any gain or loss of 10 to 15 pounds or more can change the way your pouch fits.  Going Away From Home A collapsible cup (like those that come in travel kits) or a soft plastic squirt bottle with a pull-up top (like a travel bottle for shampoo) can be used for rinsing your pouch when you are away from home. Tilt the opening of the pouch at an upward angle when using a cup to rinse.  Carry wet wipes or extra tissues to use in public bathrooms.  Carry an extra pouching system with you at all times.  Never keep ostomy supplies in the glove compartment of your car. Extreme heat or cold can damage the skin barriers and adhesive wafers on the pouch.  When you travel, carry your ostomy supplies with you at all times. Keep them within easy reach. Do not pack ostomy supplies in baggage that will be checked or otherwise separated from you, because your baggage might be lost. If you're traveling out of the country, it is helpful to have a letter stating that you are carrying ostomy supplies as a medical necessity.  If you need ostomy supplies while traveling, look in the yellow pages of the telephone book under "Surgical Supplies." Or call the local ostomy organization to find out where supplies are available.  Do not let your ostomy supplies get low. Always order new pouches before you use the last one.  Reducing Odor Limit foods such as broccoli, cabbage, onions, fish, and garlic in your diet to help reduce odor. Each time you empty your pouch, carefully clean the opening of the pouch, both inside and outside, with toilet paper. Rinse your pouch 1 or 2 times daily after you empty it (see directions for emptying your pouch and going away from home). Add deodorant (such as Super Banish or Nullo) to your pouch. Use air deodorizers in your bathroom. Do not add aspirin to your pouch. Even though  aspirin can help prevent odor, it   could cause ulcers on your stoma.  When to call the doctor Call the doctor if you have any of the following symptoms: Purple, black, or white stoma Severe cramps lasting more than 6 hours Severe watery discharge from the stoma lasting more than 6 hours No output from the colostomy for 3 days Excessive bleeding from your stoma Swelling of your stoma to more than 1/2-inch larger than usual Pulling inward of your stoma below skin level Severe skin irritation or deep ulcers Bulging or other changes in your abdomen  When to call your ostomy nurse Call your ostomy/enterostomal therapy (WOCN) nurse if any of the following occurs: Frequent leaking of your pouching system Change in size or appearance of your stoma, causing discomfort or problems with your pouch Skin rash or rawness Weight gain or loss that causes problems with your pouch     FREQUENTLY ASKED QUESTIONS   Why haven't you met any of these folks who have an ostomy?  Well, maybe you have! You just did not recognize them because an ostomy doesn't show. It can be kept secret if you wish. Why, maybe some of your best friends, office associates or neighbors have an ostomy ... you never can tell. People facing ostomy surgery have many quality-of-life questions like: Will you bulge? Smell? Make noises? Will you feel waste leaving your body? Will you be a captive of the toilet? Will you starve? Be a social outcast? Get/stay married? Have babies? Easily bathe, go swimming, bend over?  OK, let's look at what you can expect:   Will you bulge?  Remember, without part of the intestine or bladder, and its contents, you should have a flatter tummy than before. You can expect to wear, with little exception, what you wore before surgery ... and this in-cludes tight clothing and bathing suits.   Will you smell?  Today, thanks to modern odor proof pouching systems, you can walk into an ostomy support group  meeting and not smell anything that is foul or offensive. And, for those with an ileostomy or colostomy who are concerned about odor when emptying their pouch, there are in-pouch deodorants that can be used to eliminate any waste odors that may exist.   Will you make noises?  Everyone produces gas, especially if they are an air-swallower. But intestinal sounds that occur from time to time are no differ-ent than a gurgling tummy, and quite often your clothing will muffle any sounds.   Will you feel the waste discharges?  For those with a colostomy or ileostomy there might be a slight pressure when waste leaves your body, but understand that the intestines have no nerve endings, so there will be no unpleasant sensations. Those with a urostomy will probably be unaware of any kidney drainage.   Will you be a captive of the toilet?  Immediately post-op you will spend more time in the bathroom than you will after your body recovers from surgery. Every person is different, but on average those with an ileostomy or urostomy may empty their pouches 4 to 6 times a day; a little  less if you have a colostomy. The average wear time between pouch system changes is 3 to 5 days and the changing process should take less than 30 minutes.   Will I need to be on a special diet? Most people return to their normal diet when they have recovered from surgery. Be sure to chew your food well, eat a well-balanced diet and drink plenty of fluids. If   you experience problems with a certain food, wait a couple of weeks and try it again.  Will there be odor and noises? Pouching systems are designed to be odor-proof or odor-resistant. There are deodorants that can be used in the pouch. Medications are also available to help reduce odor. Limit gas-producing foods and carbonated beverages. You will experience less gas and fewer noises as you heal from surgery.  How much time will it take to care for my ostomy? At first, you may  spend a lot of time learning about your ostomy and how to take care of it. As you become more comfortable and skilled at changing the pouching system, it will take very little time to care for it.   Will I be able to return to work? People with ostomies can perform most jobs. As soon as you have healed from surgery, you should be able to return to work. Heavy lifting (more than 10 pounds) may be discouraged.   What about intimacy? Sexual relationships and intimacy are important and fulfilling aspects of your life. They should continue after ostomy surgery. Intimacy-related concerns should be discussed openly between you and your partner.   Can I wear regular clothing? You do not need to wear special clothing. Ostomy pouches are fairly flat and barely noticeable. Elastic undergarments will not hurt the stoma or prevent the ostomy from functioning.   Can I participate in sports? An ostomy should not limit your involvement in sports. Many people with ostomies are runners, skiers, swimmers or participate in other active lifestyles. Talk with your caregiver first before doing heavy physical activity.  Will you starve?  Not if you follow doctor's orders at each stage of your post-op adjustment. There is no such thing as an "ostomy diet". Some people with an ostomy will be able to eat and tolerate anything; others may find diffi-culty with some foods. Each person is an individual and must determine, by trial, what is best for them. A good practice for all is to drink plenty of water.   Will you be a social outcast?  Have you met anyone who has an ostomy and is a social outcast? Why should you be the first? Only your attitude and self image will effect how you are treated. No confi-dent person is an outcast.    PROFESSIONAL HELP   Resources are available if you need help or have questions about your ostomy.   Specially trained nurses called Wound, Ostomy Continence Nurses (WOCN) are available for  consultation in most major medical centers.  Consider getting an ostomy consult at an outpatient ostomy clinic.   [facility] has an Ostomy Clinic run by an WOCN ostomy nurse at the [facility].  [phone]. [facility] can help set up an appointment   The United Ostomy Association (UOA) is a group made up of many local chapters throughout the [place]. These local groups hold meetings and provide support to prospective and existing ostomates. They sponsor educational events and have qualified visitors to make personal or telephone visits. Contact the UOA for the chapter nearest you and for other educational publications.  More detailed information can be found in Colostomy Guide, a publication of the United Ostomy Association (UOA). Contact UOA at 1-800-826-0826 or visit their web site at www.uoaa.org. The website contains links to other sites, suppliers and resources.  [employer] Secure Start Services: Start at the website to enlist for support.  Your Wound Ostomy (WOCN) nurse may have started this   process. https://www.[employer].com/en/securestart Secure Start services are designed to support people as they live their lives with an ostomy or neurogenic bladder. Enrolling is easy and at no cost to the patient. We realize that each person's needs and life journey are different. Through Secure Start services, we want to help people live their life, their way.  #######################################################  

## 2022-07-13 ENCOUNTER — Telehealth: Payer: Self-pay

## 2022-07-13 NOTE — Patient Outreach (Signed)
  Care Coordination TOC Note Transition Care Management Unsuccessful Follow-up Telephone Call  Date of discharge and from where:  [facility] 07/11/22  Attempts:  1st Attempt  Reason for unsuccessful TCM follow-up call:  No answer/busy  Johnney Killian, RN, BSN, CCM Care Management Coordinator [facility] Phone: [phone]: [phone]

## 2022-07-14 ENCOUNTER — Telehealth: Payer: Self-pay

## 2022-07-14 NOTE — Patient Outreach (Signed)
  Care Coordination TOC Note Transition Care Management Follow-up Telephone Call Date of discharge and from where: [facility] 07/11/22 How have you been since you were released from the hospital? "I am feeling good, having a little pain". Any questions or concerns? Yes-Patient is unclear why she was referred to Dr. Burr Medico for chemotherapy evaluation as her surgeon, Dr. Marcello Moores, did not mention chemo.  Advised patient to reach out to Dr. Marcello Moores via my chart or phone call and ask for clarification.  Items Reviewed: Did the pt receive and understand the discharge instructions provided? Yes  Medications obtained and verified? Yes  Other? No  Any new allergies since your discharge? No  Dietary orders reviewed? Yes Do you have support at home? Yes   Home Care and Equipment/Supplies: Were home health services ordered? not applicable If so, what is the name of the agency? N/a  Has the agency set up a time to come to the patient's home? not applicable Were any new equipment or medical supplies ordered?  No What is the name of the medical supply agency? N/a Were you able to get the supplies/equipment? not applicable Do you have any questions related to the use of the equipment or supplies? No  Functional Questionnaire: (I = Independent and D = Dependent) ADLs: I  Bathing/Dressing- I  Meal Prep- I  Eating- I  Maintaining continence- I  Transferring/Ambulation- I  Managing Meds- I  Follow up appointments reviewed:  PCP Hospital f/u appt confirmed? No   Specialist Hospital f/u appt confirmed? Yes  Scheduled to see Dr. Marcello Moores on 1/22 @ 11:00. Are transportation arrangements needed? No  If their condition worsens, is the pt aware to call PCP or go to the Emergency Dept.? Yes Was the patient provided with contact information for the PCP's office or ED? Yes Was to pt encouraged to call back with questions or concerns? Yes  SDOH assessments and interventions completed:   Yes SDOH  Interventions Today    Flowsheet Row Most Recent Value  SDOH Interventions   Food Insecurity Interventions Intervention Not Indicated  Housing Interventions Intervention Not Indicated       Care Coordination Interventions:  No Care Coordination interventions needed at this time.   Encounter Outcome:  Pt. Visit Completed

## 2022-07-14 NOTE — Progress Notes (Signed)
I spoke with Sara Dorsey.  She is agreeable to appt with Dr Burr Medico on 1/23 at 1100.

## 2022-07-15 ENCOUNTER — Other Ambulatory Visit: Payer: Self-pay

## 2022-07-15 NOTE — Progress Notes (Signed)
The proposed treatment discussed in conference is for discussion purpose only and is not a binding recommendation.  The patients have not been physically examined, or presented with their treatment options.  Therefore, final treatment plans cannot be decided.  

## 2022-07-20 LAB — SURGICAL PATHOLOGY

## 2022-07-24 IMAGING — MG MM DIGITAL SCREENING BILAT W/ TOMO AND CAD
8 series · 8 of 24 positions shown · non-contrast
Comparison: Previous exam(s).

CLINICAL DATA: Screening.

EXAM:
DIGITAL SCREENING BILATERAL MAMMOGRAM WITH TOMOSYNTHESIS AND CAD
TECHNIQUE: Bilateral screening digital craniocaudal and mediolateral oblique
mammograms were obtained. Bilateral screening digital breast
tomosynthesis was performed. The images were evaluated with
computer-aided detection.

[L MLO synth-2D]
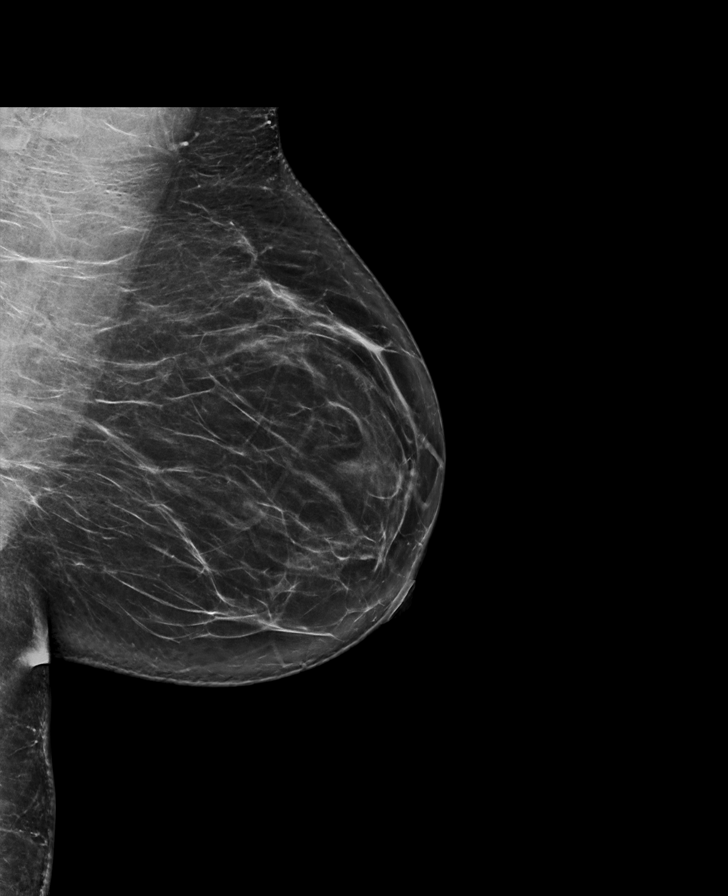

[R CC synth-2D]
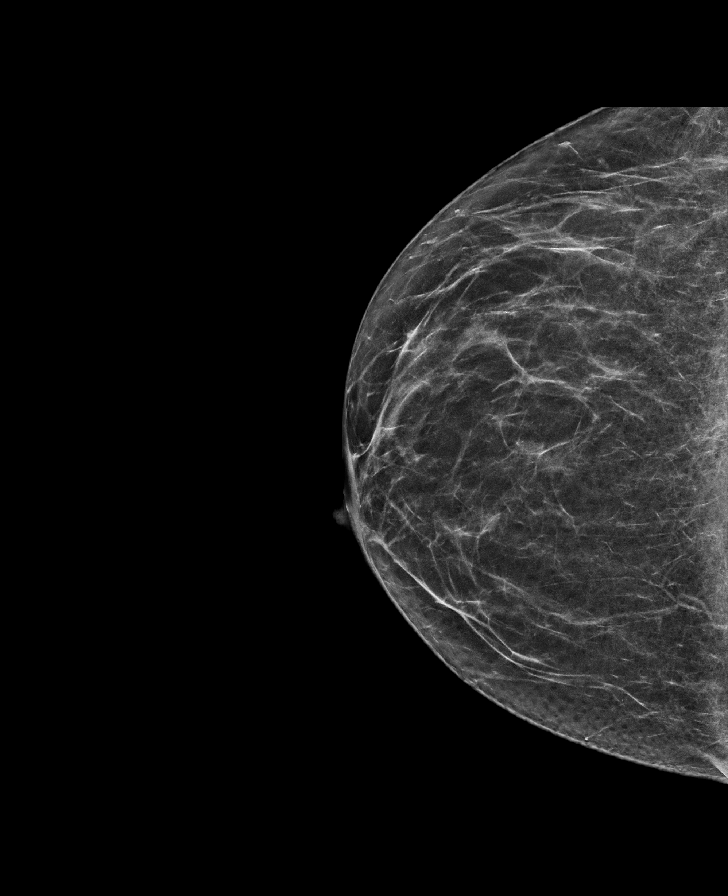

[R MLO synth-2D]
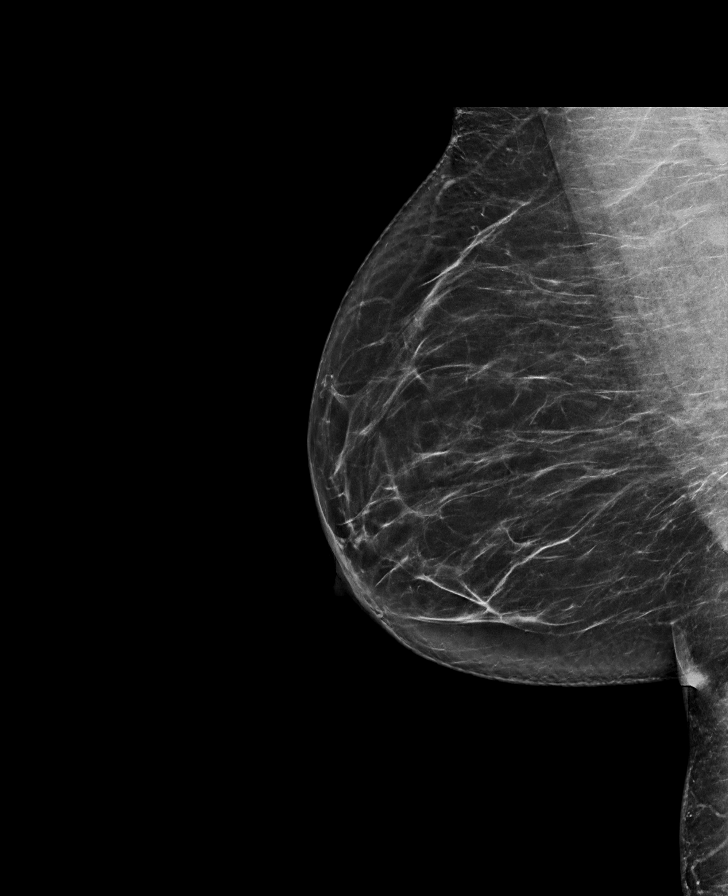

[L CC synth-2D]
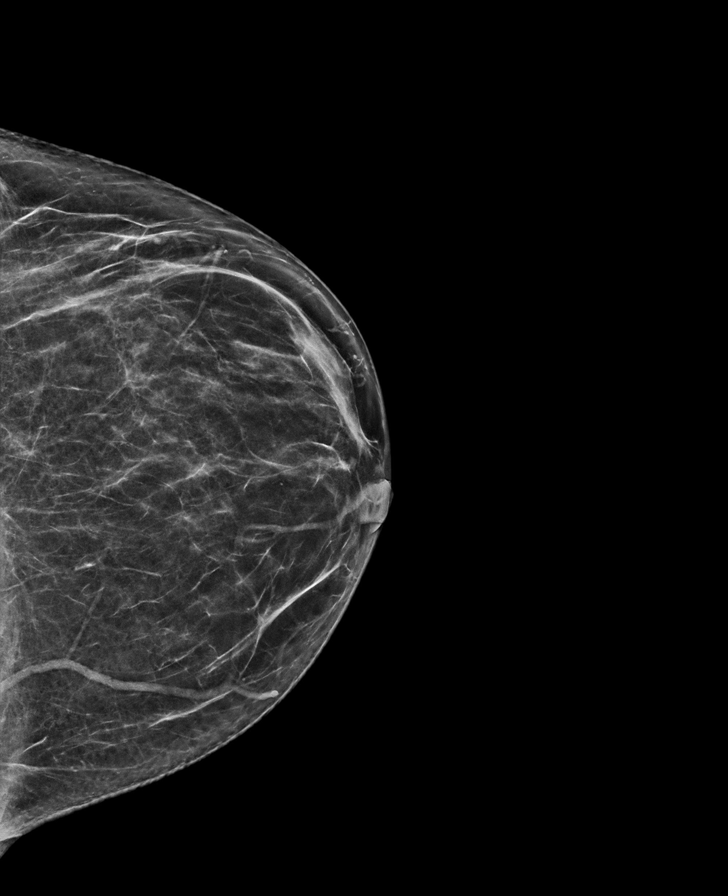

[L MLO tomo · tomo slice 41/82.0]
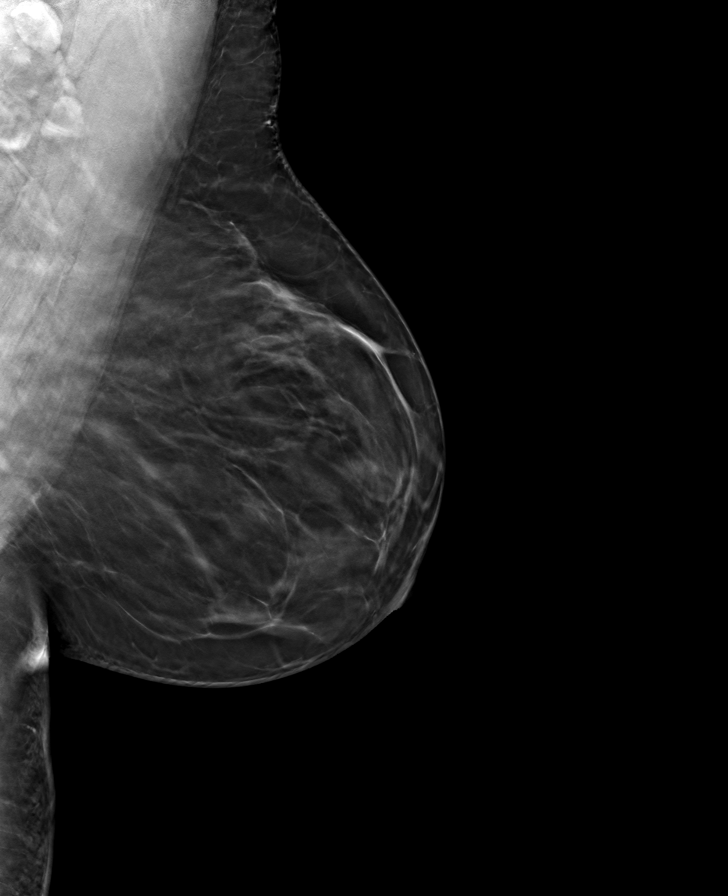

[R MLO tomo · tomo slice 41/82.0]
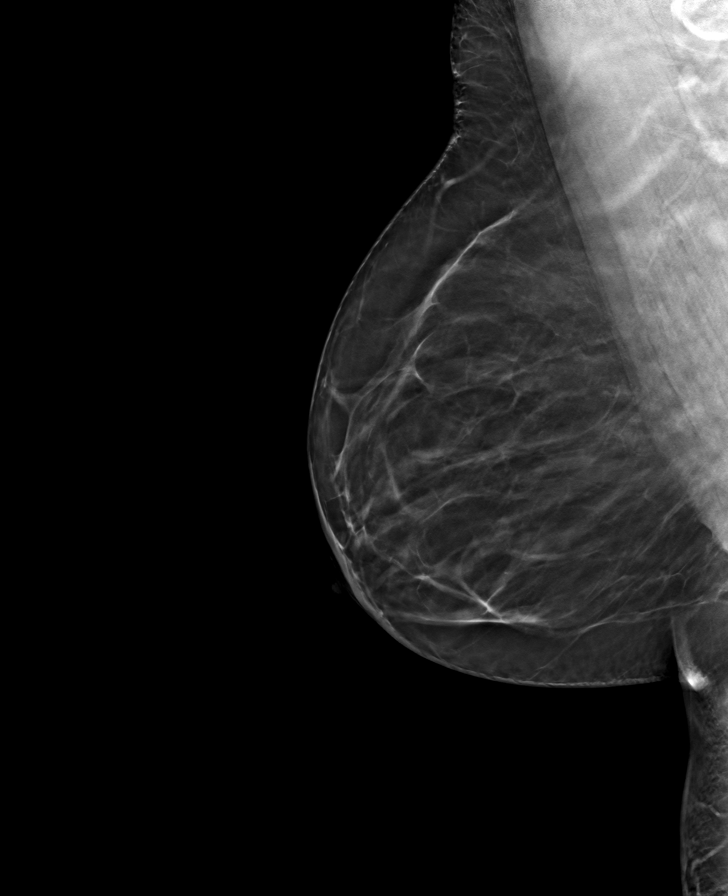

[R CC tomo · tomo slice 32/63.0]
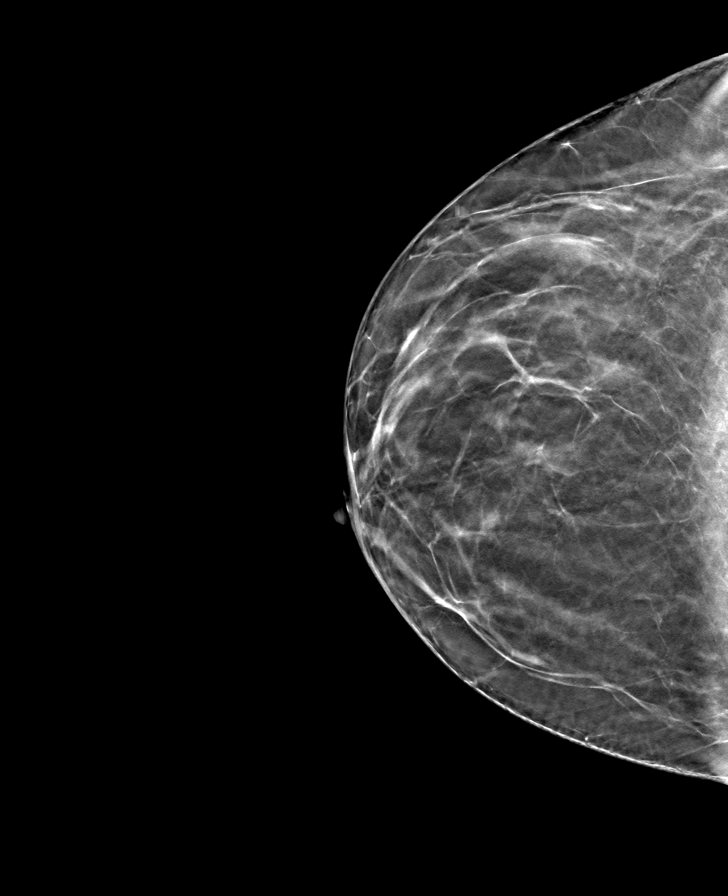

[L CC tomo · tomo slice 33/64.0]
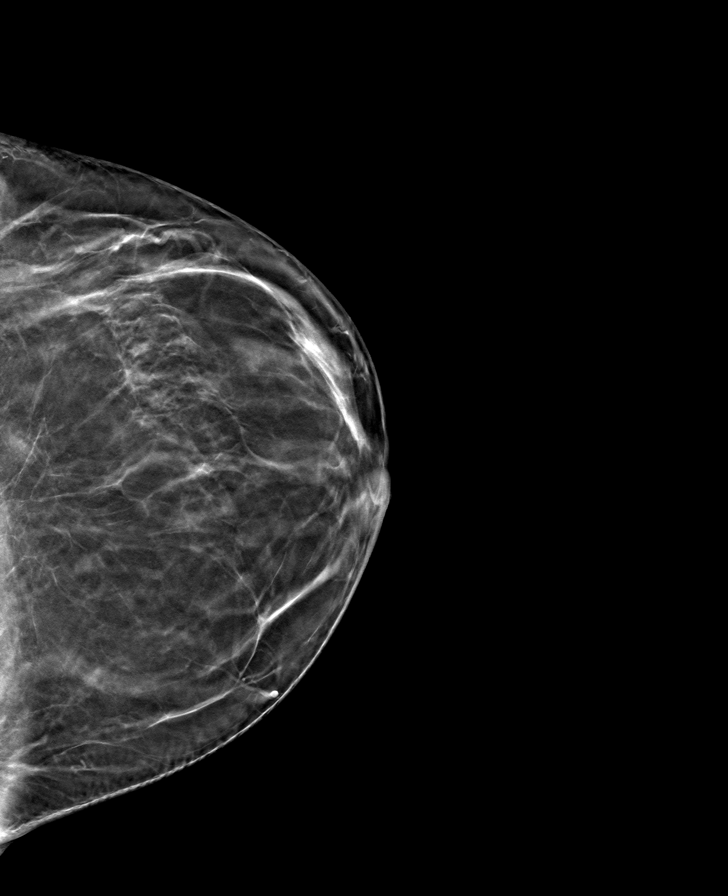

[8 of 24 positions shown; findings below may reference images not displayed]

ACR Breast Density Category b: There are scattered areas of
fibroglandular density.
FINDINGS: There are no findings suspicious for malignancy.
IMPRESSION: No mammographic evidence of malignancy. A result letter of this
screening mammogram will be mailed directly to the patient.

RECOMMENDATION:
Screening mammogram in one year. (Code:51-O-LD2)

BI-RADS CATEGORY  1: Negative.

## 2022-07-27 NOTE — Progress Notes (Signed)
[facility]   Telephone:(336) [phone] Fax:(336) [phone]   Clinic Follow up Note   Patient Care Team: Dickie La, MD as PCP - General (Family Medicine) Truitt Merle, MD as Consulting Physician (Oncology) Daryel November, MD as Consulting Physician (Gastroenterology) Leighton Ruff, MD as Consulting Physician (General Surgery) Truitt Merle, MD as Consulting Physician (Hematology)  Date of Service:  07/28/2022  CHIEF COMPLAINT: f/u of Colorectal cancer   CURRENT THERAPY:  Surveillance  ASSESSMENT:  Sara Dorsey is a 52 y.o. 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 -She underwent hemicolectomy on July 09, 2022.  I reviewed her surgical pathology, which showed well to moderately differentiated adenocarcinoma, T3, or 20 lymph nodes were negative, margins were negative, no lymphovascular invasion or perineural invasion. -We discussed that if she had a stage II colon cancer, does not have high risk features, so I do not recommend adjuvant chemotherapy. -We discussed her low to moderate risk of recurrence, especially in the first 2 to 3 years.  I recommend cancer surveillance with routine lab, including CEA, physical exam, and CT scan every year for 2 to 3 years.  She will have a repeat colonoscopy in January 2025. -We discussed the role of circulating tumor DNA in cancer surveillance, she would like to proceed, I ordered GuardantREveal to be done every 2-3 months X3  -We also discussed healthy diet, regular exercise, weight watch, vitD supplement, etc., to help her reduce risk of recurrence.    PLAN: - Discuss the surgical path in detail  -I do not recommend adjuvant chemotherapy  -Patient agrees with his cancer surveillance. --Order Guardant Reveal X3, will start in 2-3 wks - encourage patient to eat healthy and exercise. -lab,f/u in 4  months  SUMMARY OF ONCOLOGIC HISTORY: Oncology History Overview Note   Cancer Staging  Cancer of sigmoid colon [facility]) Staging form: Colon and Rectum, AJCC 8th Edition - Pathologic stage from 07/09/2022: Stage IIA (pT3, pN0, cM0) - Signed by Truitt Merle, MD on 07/28/2022 Total positive nodes: 0 Histologic grading system: 4 grade system Histologic grade (G): G1 Residual tumor (R): R0 - None     Cancer of sigmoid colon ([place])  05/25/2022 Procedure   Colonoscopy by Dr. Dustin Flock - Likely malignant partially obstructing tumor in the recto-sigmoid colon. Biopsied. Tattooed. - One 14 mm polyp at the recto-sigmoid colon. This polyp was not removed due to it's very close proximity to the mass. - One 12 mm polyp in the ascending colon, removed with a cold snare. Resected and retrieved. - One 3 mm polyp in the descending colon, removed with a cold snare. Resected and retrieved. - The distal rectum and anal verge are normal on retroflexion view.   05/25/2022 Tumor Marker   CEA 3.4   06/02/2022 Imaging   CT CAP IMPRESSION: 1. Short segment asymmetric nodular wall thickening of the distal sigmoid colon/proximal rectum, likely reflecting patient's known primary colonic neoplasm. 2. No evidence of metastatic disease within the chest, abdomen or pelvis. 3. Nonobstructive punctate bilateral renal stones. 4. Enlarged uterus with multiple intrauterine masses measuring up to 5.1 cm, compatible with uterine leiomyomas.     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 Colon, polyp(s), Ascending, Descending, x 2 - TUBULAR ADENOMA(S). - NO HIGH GRADE DYSPLASIA OR MALIGNANCY. 2. Rectosigmoid , biopsies - INVASIVE ADENOCARCINOMA, MODERATELY DIFFERENTIATED (SEE  NOTE)   07/09/2022 Cancer Staging   Staging form: Colon and Rectum, AJCC 8th Edition - Pathologic stage from 07/09/2022: Stage IIA (pT3, pN0, cM0) - Signed by Truitt Merle, MD on 07/28/2022 Total positive nodes:  0 Histologic grading system: 4 grade system Histologic grade (G): G1 Residual tumor (R): R0 - None   07/09/2022 Pathology Results    FINAL MICROSCOPIC DIAGNOSIS:   A. COLON, RECTOSIGMOID, RESECTION:  -  Invasive well to moderately differentiated adenocarcinoma extending  through the mucosa, submucosa and into pericolonic soft tissue (6.1 cm  in greatest dimension)  -  Margins negative  pT3 pN0; see synoptic report for further information   B. FINAL DISTAL MARGIN:  -  Unremarkable colon, negative for malignancy.   C. UTERINE FIBROID, EXCISION:  -  Benign leiomyoma.       INTERVAL HISTORY:  Sara Dorsey is here for a follow up of  Colorectal cancer She was last seen by NP Lacie on 06/03/2022 She presents to the clinic alone. Pt reports that's the surgery went pretty good and she was hospitalize for about two days. She states she feels a little pressure when moving around.     All other systems were reviewed with the patient and are negative.  MEDICAL HISTORY:  Past Medical History:  Diagnosis Date   Hypertension     SURGICAL HISTORY: Past Surgical History:  Procedure Laterality Date   TUBAL LIGATION  2000    I have reviewed the social history and family history with the patient and they are unchanged from previous note.  ALLERGIES:  is allergic to lisinopril.  MEDICATIONS:  Current Outpatient Medications  Medication Sig Dispense Refill   amLODipine (NORVASC) 2.5 MG tablet Take 1 tablet (2.5 mg total) by mouth at bedtime. 90 tablet 3   fluticasone (FLONASE) 50 MCG/ACT nasal spray Place 2 sprays into both nostrils daily. (Patient taking differently: Place 2 sprays into both nostrils daily as needed for allergies (Summer months).) 16 g 12   traMADol (ULTRAM) 50 MG tablet Take 1-2 tablets (50-100 mg total) by mouth every 6 (six) hours as needed. 30 tablet 0   No current facility-administered medications for this visit.    PHYSICAL EXAMINATION: ECOG  PERFORMANCE STATUS: 0 - Asymptomatic  Vitals:   07/28/22 1055  BP: (!) 141/76  Pulse: 62  Resp: 18  Temp: 99 F (37.2 C)  SpO2: 100%   Wt Readings from Last 3 Encounters:  07/28/22 182 lb 8 oz (82.8 kg)  07/11/22 179 lb 0.2 oz (81.2 kg)  06/22/22 180 lb (81.6 kg)     GENERAL:alert, no distress and comfortable SKIN: skin color normal, no rashes or significant lesions EYES: normal, Conjunctiva are pink and non-injected, sclera clear  NEURO: alert & oriented x 3 with fluent speech ABDOMEN:abdomen soft, non-tender and normal bowel sounds Incision is healing well. Musculoskeletal:no cyanosis of digits and no clubbing   LABORATORY DATA:  I have reviewed the data as listed    Latest Ref Rng & Units 07/11/2022    5:09 AM 07/10/2022    4:46 AM 06/22/2022    2:37 PM  CBC  WBC 4.0 - 10.5 K/uL 9.1  14.1  9.3   Hemoglobin 12.0 - 15.0 g/dL 10.3  10.2  11.3   Hematocrit 36.0 - 46.0 % 32.6  31.5  35.9   Platelets 150 - 400 K/uL 259  247  341         Latest Ref Rng & Units 07/11/2022    5:09  AM 07/10/2022    4:46 AM 06/22/2022    2:37 PM  CMP  Glucose 70 - 99 mg/dL 101  102  105   BUN 6 - 20 mg/dL '11  7  14   '$ Creatinine 0.44 - 1.00 mg/dL 0.78  0.80  0.76   Sodium 135 - 145 mmol/L 141  139  139   Potassium 3.5 - 5.1 mmol/L 3.5  3.4  3.7   Chloride 98 - 111 mmol/L 107  107  108   CO2 22 - 32 mmol/L '26  25  24   '$ Calcium 8.9 - 10.3 mg/dL 8.5  8.8  9.4       RADIOGRAPHIC STUDIES: I have personally reviewed the radiological images as listed and agreed with the findings in the report. No results found.    Orders Placed This Encounter  Procedures   Guardant 360    GuardantReveal    Standing Status:   Standing    Number of Occurrences:   5    Standing Expiration Date:   07/29/2023   All questions were answered. The patient knows to call the clinic with any problems, questions or concerns. No barriers to learning was detected. The total time spent in the appointment was 30  minutes.     Truitt Merle, MD 07/28/2022   Felicity Coyer, CMA, am acting as scribe for Truitt Merle, MD.   I have reviewed the above documentation for accuracy and completeness, and I agree with the above.

## 2022-07-28 ENCOUNTER — Encounter: Payer: Self-pay | Admitting: Hematology

## 2022-07-28 ENCOUNTER — Inpatient Hospital Stay: Payer: BC Managed Care – PPO | Attending: Nurse Practitioner | Admitting: Hematology

## 2022-07-28 ENCOUNTER — Other Ambulatory Visit: Payer: Self-pay

## 2022-07-28 VITALS — BP 141/76 | HR 62 | Temp 99.0°F | Resp 18 | Ht 62.0 in | Wt 182.5 lb

## 2022-07-28 DIAGNOSIS — C19 Malignant neoplasm of rectosigmoid junction: Secondary | ICD-10-CM | POA: Diagnosis not present

## 2022-07-28 DIAGNOSIS — C187 Malignant neoplasm of sigmoid colon: Secondary | ICD-10-CM | POA: Diagnosis not present

## 2022-07-28 NOTE — Assessment & Plan Note (Addendum)
-  pT3N0M0, stage II, MSS, G1 -She was not interested in November 2023 by colonoscopy for intermittent hematochezia. -She underwent hemicolectomy on July 09, 2022.  I reviewed her surgical pathology, which showed well to moderately differentiated adenocarcinoma, T3, or 20 lymph nodes were negative, margins were negative, no lymphovascular invasion or perineural invasion. -We discussed that if she had a stage II colon cancer, does not have high risk features, so I do not recommend adjuvant chemotherapy. -We discussed her low to moderate risk of recurrence, especially in the first 2 to 3 years.  I recommend cancer surveillance with routine lab, including CEA, physical exam, and CT scan every year for 2 to 3 years.  She will have a repeat colonoscopy in January 2025. -We discussed the role of circulating tumor DNA in cancer surveillance, she would like to proceed, I ordered GuardantREveal to be done every 2-3 months X3

## 2022-07-29 ENCOUNTER — Other Ambulatory Visit: Payer: Self-pay

## 2022-08-12 ENCOUNTER — Other Ambulatory Visit: Payer: Self-pay

## 2022-08-12 ENCOUNTER — Inpatient Hospital Stay: Payer: BC Managed Care – PPO | Attending: Nurse Practitioner

## 2022-08-12 DIAGNOSIS — C187 Malignant neoplasm of sigmoid colon: Secondary | ICD-10-CM

## 2022-08-21 DIAGNOSIS — C187 Malignant neoplasm of sigmoid colon: Secondary | ICD-10-CM | POA: Diagnosis not present

## 2022-08-21 DIAGNOSIS — C189 Malignant neoplasm of colon, unspecified: Secondary | ICD-10-CM | POA: Diagnosis not present

## 2022-08-24 NOTE — Telephone Encounter (Signed)
Open in error

## 2022-08-25 ENCOUNTER — Encounter: Payer: Self-pay | Admitting: Hematology

## 2022-08-27 LAB — GUARDANT 360

## 2022-11-04 ENCOUNTER — Other Ambulatory Visit: Payer: Self-pay

## 2022-11-04 DIAGNOSIS — C19 Malignant neoplasm of rectosigmoid junction: Secondary | ICD-10-CM

## 2022-11-04 DIAGNOSIS — C187 Malignant neoplasm of sigmoid colon: Secondary | ICD-10-CM

## 2022-11-06 ENCOUNTER — Telehealth: Payer: Self-pay | Admitting: Nurse Practitioner

## 2022-11-10 ENCOUNTER — Inpatient Hospital Stay: Payer: BC Managed Care – PPO

## 2022-11-22 NOTE — Progress Notes (Signed)
Patient Care Team: Nestor Ramp, MD as PCP - General (Family Medicine) Sara Mood, MD as Consulting Physician (Oncology) Jenel Lucks, MD as Consulting Physician (Gastroenterology) Romie Levee, MD as Consulting Physician (General Surgery) Sara Mood, MD as Consulting Physician (Hematology)   CHIEF COMPLAINT: Follow up colon cancer   Oncology History Overview Note   Cancer Staging  Cancer of sigmoid colon [facility]) Staging form: Colon and Rectum, AJCC 8th Edition - Pathologic stage from 07/09/2022: Stage IIA (pT3, pN0, cM0) - Signed by Sara Mood, MD on 07/28/2022 Total positive nodes: 0 Histologic grading system: 4 grade system Histologic grade (G): G1 Residual tumor (R): R0 - None     Cancer of sigmoid colon (HCC)  05/25/2022 Procedure   Colonoscopy by Dr. Tiajuana Amass - Likely malignant partially obstructing tumor in the recto-sigmoid colon. Biopsied. Tattooed. - One 14 mm polyp at the recto-sigmoid colon. This polyp was not removed due to it's very close proximity to the mass. - One 12 mm polyp in the ascending colon, removed with a cold snare. Resected and retrieved. - One 3 mm polyp in the descending colon, removed with a cold snare. Resected and retrieved. - The distal rectum and anal verge are normal on retroflexion view.   05/25/2022 Tumor Marker   CEA 3.4   06/02/2022 Imaging   CT CAP IMPRESSION: 1. Short segment asymmetric nodular wall thickening of the distal sigmoid colon/proximal rectum, likely reflecting patient's known primary colonic neoplasm. 2. No evidence of metastatic disease within the chest, abdomen or pelvis. 3. Nonobstructive punctate bilateral renal stones. 4. Enlarged uterus with multiple intrauterine masses measuring up to 5.1 cm, compatible with uterine leiomyomas.     06/03/2022 Initial Diagnosis   Rectosigmoid cancer (HCC)    Initial Biopsy   1. Colon, polyp(s), Ascending, Descending, x 2 - TUBULAR ADENOMA(S). - NO HIGH GRADE  DYSPLASIA OR MALIGNANCY. 2. Rectosigmoid , biopsies - INVASIVE ADENOCARCINOMA, MODERATELY DIFFERENTIATED (SEE NOTE)   07/09/2022 Cancer Staging   Staging form: Colon and Rectum, AJCC 8th Edition - Pathologic stage from 07/09/2022: Stage IIA (pT3, pN0, cM0) - Signed by Sara Mood, MD on 07/28/2022 Total positive nodes: 0 Histologic grading system: 4 grade system Histologic grade (G): G1 Residual tumor (R): R0 - None   07/09/2022 Pathology Results    FINAL MICROSCOPIC DIAGNOSIS:   A. COLON, RECTOSIGMOID, RESECTION:  -  Invasive well to moderately differentiated adenocarcinoma extending  through the mucosa, submucosa and into pericolonic soft tissue (6.1 cm  in greatest dimension)  -  Margins negative  pT3 pN0; see synoptic report for further information   B. FINAL DISTAL MARGIN:  -  Unremarkable colon, negative for malignancy.   C. UTERINE FIBROID, EXCISION:  -  Benign leiomyoma.       CURRENT THERAPY: Surveillance   INTERVAL HISTORY Sara Dorsey returns for follow up as scheduled. Last seen by Dr. Mosetta Putt 07/28/22 for surveillance. Guardant Reveal 08/12/22 (+5 weeks) was not detected.   ROS   Past Medical History:  Diagnosis Date  . Hypertension      Past Surgical History:  Procedure Laterality Date  . TUBAL LIGATION  2000     Outpatient Encounter Medications as of 11/23/2022  Medication Sig  . amLODipine (NORVASC) 2.5 MG tablet Take 1 tablet (2.5 mg total) by mouth at bedtime.  . fluticasone (FLONASE) 50 MCG/ACT nasal spray Place 2 sprays into both nostrils daily. (Patient taking differently: Place 2 sprays into both nostrils daily as needed for allergies (Summer months).)  .  traMADol (ULTRAM) 50 MG tablet Take 1-2 tablets (50-100 mg total) by mouth every 6 (six) hours as needed.   No facility-administered encounter medications on file as of 11/23/2022.     There were no vitals filed for this visit. There is no height or weight on file to calculate BMI.   PHYSICAL  EXAM GENERAL:alert, no distress and comfortable SKIN: no rash  EYES: sclera clear NECK: without mass LYMPH:  no palpable cervical or supraclavicular lymphadenopathy  LUNGS: clear with normal breathing effort HEART: regular rate & rhythm, no lower extremity edema ABDOMEN: abdomen soft, non-tender and normal bowel sounds NEURO: alert & oriented x 3 with fluent speech, no focal motor/sensory deficits Breast exam:  PAC without erythema    CBC    Component Value Date/Time   WBC 9.1 07/11/2022 0509   RBC 3.72 (L) 07/11/2022 0509   HGB 10.3 (L) 07/11/2022 0509   HCT 32.6 (L) 07/11/2022 0509   PLT 259 07/11/2022 0509   MCV 87.6 07/11/2022 0509   MCH 27.7 07/11/2022 0509   MCHC 31.6 07/11/2022 0509   RDW 14.6 07/11/2022 0509   LYMPHSABS 2.1 05/25/2022 1634   MONOABS 0.4 05/25/2022 1634   EOSABS 0.6 05/25/2022 1634   BASOSABS 0.1 05/25/2022 1634     CMP     Component Value Date/Time   NA 141 07/11/2022 0509   NA 138 01/08/2021 1059   K 3.5 07/11/2022 0509   CL 107 07/11/2022 0509   CO2 26 07/11/2022 0509   GLUCOSE 101 (H) 07/11/2022 0509   BUN 11 07/11/2022 0509   BUN 9 01/08/2021 1059   CREATININE 0.78 07/11/2022 0509   CREATININE 1.01 01/15/2016 0854   CALCIUM 8.5 (L) 07/11/2022 0509   PROT 8.0 05/25/2022 1634   PROT 7.5 01/08/2021 1059   ALBUMIN 4.1 05/25/2022 1634   ALBUMIN 4.2 01/08/2021 1059   AST 14 05/25/2022 1634   ALT 9 05/25/2022 1634   ALKPHOS 71 05/25/2022 1634   BILITOT 0.4 05/25/2022 1634   BILITOT 0.3 01/08/2021 1059   GFRNONAA >60 07/11/2022 0509   GFRNONAA 66 01/15/2016 0854   GFRAA 77 01/15/2016 0854     ASSESSMENT & PLAN:52 year old female   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  First colonoscopy showed partially obstructing rectosigmoid tumor, path confirmed G2 invasive  adenocarcinoma.  -Baseline CEA normal, 3.4 -We reviewed her staging CT CAP negative for distant metastasis or local regional adenopathy   -We discussed the risk of cancer recurrence in the future and the surveillance plan, which is a physical exam and lab test (including CBC, CMP and CEA) every 3 months for the first 2 years, then every 6-12 months, colonoscopy one year from surgery, and surveilliance CT scan every 6-12 month for up to 3 years (unless she has more advanced disease) -Pt seen with Dr. Mosetta Putt and GI nurse Navigator, Gardiner Coins    2. Hematochezia, cramping -Secondary to #1 -mild   3. HTN -on amlodipine     PLAN:  No orders of the defined types were placed in this encounter.     All questions were answered. The patient knows to call the clinic with any problems, questions or concerns. No barriers to learning were detected. I spent *** counseling the patient face to face. The total time spent in the appointment was *** and more than 50% was on counseling, review of test results, and coordination of  care.   Santiago Glad, NP-C @DATE @

## 2022-11-23 ENCOUNTER — Other Ambulatory Visit: Payer: Self-pay

## 2022-11-23 ENCOUNTER — Inpatient Hospital Stay: Payer: BC Managed Care – PPO | Attending: Nurse Practitioner

## 2022-11-23 ENCOUNTER — Encounter: Payer: Self-pay | Admitting: Nurse Practitioner

## 2022-11-23 ENCOUNTER — Inpatient Hospital Stay: Payer: BC Managed Care – PPO | Admitting: Nurse Practitioner

## 2022-11-23 VITALS — BP 141/78 | HR 57 | Temp 98.2°F | Resp 16 | Ht 62.0 in | Wt 190.3 lb

## 2022-11-23 DIAGNOSIS — I10 Essential (primary) hypertension: Secondary | ICD-10-CM | POA: Insufficient documentation

## 2022-11-23 DIAGNOSIS — C187 Malignant neoplasm of sigmoid colon: Secondary | ICD-10-CM

## 2022-11-23 DIAGNOSIS — R200 Anesthesia of skin: Secondary | ICD-10-CM | POA: Diagnosis not present

## 2022-11-23 DIAGNOSIS — K5900 Constipation, unspecified: Secondary | ICD-10-CM | POA: Insufficient documentation

## 2022-11-23 DIAGNOSIS — R202 Paresthesia of skin: Secondary | ICD-10-CM | POA: Insufficient documentation

## 2022-11-23 DIAGNOSIS — C19 Malignant neoplasm of rectosigmoid junction: Secondary | ICD-10-CM

## 2022-11-23 LAB — IRON AND IRON BINDING CAPACITY (CC-WL,HP ONLY)
Iron: 56 ug/dL (ref 28–170)
Saturation Ratios: 13 % (ref 10.4–31.8)
TIBC: 449 ug/dL (ref 250–450)
UIBC: 393 ug/dL (ref 148–442)

## 2022-11-23 LAB — CBC WITH DIFFERENTIAL (CANCER CENTER ONLY)
Abs Immature Granulocytes: 0.03 10*3/uL (ref 0.00–0.07)
Basophils Absolute: 0.1 10*3/uL (ref 0.0–0.1)
Basophils Relative: 1 %
Eosinophils Absolute: 1.5 10*3/uL — ABNORMAL HIGH (ref 0.0–0.5)
Eosinophils Relative: 19 %
HCT: 37.7 % (ref 36.0–46.0)
Hemoglobin: 12.6 g/dL (ref 12.0–15.0)
Immature Granulocytes: 0 %
Lymphocytes Relative: 22 %
Lymphs Abs: 1.7 10*3/uL (ref 0.7–4.0)
MCH: 30.1 pg (ref 26.0–34.0)
MCHC: 33.4 g/dL (ref 30.0–36.0)
MCV: 90 fL (ref 80.0–100.0)
Monocytes Absolute: 0.4 10*3/uL (ref 0.1–1.0)
Monocytes Relative: 5 %
Neutro Abs: 4.1 10*3/uL (ref 1.7–7.7)
Neutrophils Relative %: 53 %
Platelet Count: 289 10*3/uL (ref 150–400)
RBC: 4.19 MIL/uL (ref 3.87–5.11)
RDW: 12.6 % (ref 11.5–15.5)
WBC Count: 7.8 10*3/uL (ref 4.0–10.5)
nRBC: 0 % (ref 0.0–0.2)

## 2022-11-23 LAB — CMP (CANCER CENTER ONLY)
ALT: 16 U/L (ref 0–44)
AST: 21 U/L (ref 15–41)
Albumin: 4.2 g/dL (ref 3.5–5.0)
Alkaline Phosphatase: 80 U/L (ref 38–126)
Anion gap: 5 (ref 5–15)
BUN: 16 mg/dL (ref 6–20)
CO2: 28 mmol/L (ref 22–32)
Calcium: 9.2 mg/dL (ref 8.9–10.3)
Chloride: 108 mmol/L (ref 98–111)
Creatinine: 0.81 mg/dL (ref 0.44–1.00)
GFR, Estimated: >60 mL/min (ref >60)
Glucose, Bld: 81 mg/dL (ref 70–99)
Potassium: 3.7 mmol/L (ref 3.5–5.1)
Sodium: 141 mmol/L (ref 135–145)
Total Bilirubin: 0.3 mg/dL (ref 0.3–1.2)
Total Protein: 7.9 g/dL (ref 6.5–8.1)

## 2022-11-23 LAB — FERRITIN: Ferritin: 29 ng/mL (ref 11–307)

## 2022-11-23 LAB — VITAMIN B12: Vitamin B-12: 382 pg/mL (ref 180–914)

## 2022-11-23 LAB — CEA (ACCESS): CEA (CHCC): 1.47 ng/mL (ref 0.00–5.00)

## 2022-11-25 LAB — METHYLMALONIC ACID, SERUM: Methylmalonic Acid, Quantitative: 257 nmol/L (ref 0–378)

## 2022-11-27 ENCOUNTER — Encounter: Payer: Self-pay | Admitting: Family Medicine

## 2022-11-27 ENCOUNTER — Ambulatory Visit: Payer: BC Managed Care – PPO | Admitting: Family Medicine

## 2022-11-27 VITALS — BP 136/82 | HR 70 | Ht 62.0 in | Wt 190.2 lb

## 2022-11-27 DIAGNOSIS — R202 Paresthesia of skin: Secondary | ICD-10-CM

## 2022-11-27 DIAGNOSIS — I10 Essential (primary) hypertension: Secondary | ICD-10-CM

## 2022-11-27 DIAGNOSIS — R200 Anesthesia of skin: Secondary | ICD-10-CM

## 2022-11-27 NOTE — Patient Instructions (Addendum)
It was great seeing you today!  Today we discussed your symptoms, I think this may be due to carpal tunnel syndrome. 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 If you experience wrist pain then you may take ibuprofen.   If you experience worsening and more persistent numbness or tingling then please return sooner, otherwise please follow up in 1-2 months.   Please follow up at your next scheduled appointment, if anything arises between now and then, please don't hesitate to contact our office.   Thank you for allowing [place] to be a part of your medical care!  Thank you, Dr. Robyne Peers

## 2022-11-27 NOTE — Assessment & Plan Note (Signed)
-  seem to be median nerve distribution, most consistent with carpal tunnel syndrome. Also considered cervical radiculopathy and stroke although low suspicion given history and exam. BP at goal and reassuringly no focal weakness noted. No indication for cervical imaging at this time.  -advised to wear wrist splint with activity and work activities -strict return precautions discussed including if patient experiencing weakness or worsening numbness and tingling -follow up in 1-2 months

## 2022-11-27 NOTE — Assessment & Plan Note (Signed)
-  BP at goal -continue amlodipine 2.5 mg

## 2022-11-27 NOTE — Progress Notes (Signed)
    SUBJECTIVE:   CHIEF COMPLAINT / HPI:   Patient presents with numbness in her fingers that started about a month ago. The sensation is primarily along her index, middle and ring fingers. Seems to be primarily along her left hand. Denies any neck pain. Denies any known inciting event or injury. 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 Also endorses tingling sensation. Denies pain. This sensation seems to be intermittent, these episodes seem to last around 15 minutes before they spontaneously resolve. This seems to occur at random times, does not notice a pattern. Sometimes occurs when she is sleeping. May occur 4-5 times throughout the day. Sometimes does not notice when she gets these episodes, this is just something that she noticed. Denies vision changes, dizziness, headaches or other symptoms.   OBJECTIVE:   BP 136/82   Pulse 70   Ht 5\' 2"  (1.575 m)   Wt 190 lb 3.2 oz (86.3 kg)   SpO2 99%   BMI 34.79 kg/m   General: Patient well-appearing, in no acute distress. HEENT: PERRLA, full active ROM of neck, no cervical tissue texture tenderness noted, no facial asymmetry noted CV: RRR, no murmurs or gallops auscultated Resp: CTAB, no wheezing, rales or rhonchi noted MSK: negative Tinel's and Spurling's testing Neuro: CN 2-12 gross intact, 5/5 UE and LE strength bilaterally, interosseous strength intact, gross sensation intact, 2+ biceps and brachioradialis reflexes, normal gait  Psych: mood appropriate   ASSESSMENT/PLAN:   Numbness and tingling -seem to be median nerve distribution, most consistent with carpal tunnel syndrome. Also considered cervical radiculopathy and stroke although low suspicion given history and exam. BP at goal and reassuringly no focal weakness noted. No indication for cervical imaging at this time.  -advised to wear wrist splint with activity and work activities -strict return precautions discussed including if patient  experiencing weakness or worsening numbness and tingling -follow up in 1-2 months   Essential hypertension, benign -BP at goal -continue amlodipine 2.5 mg      Reece Leader, DO [facility]

## 2022-11-30 DIAGNOSIS — C189 Malignant neoplasm of colon, unspecified: Secondary | ICD-10-CM | POA: Diagnosis not present

## 2022-11-30 DIAGNOSIS — C187 Malignant neoplasm of sigmoid colon: Secondary | ICD-10-CM | POA: Diagnosis not present

## 2022-12-01 ENCOUNTER — Encounter: Payer: Self-pay | Admitting: Hematology

## 2022-12-01 ENCOUNTER — Telehealth: Payer: Self-pay

## 2022-12-01 NOTE — Telephone Encounter (Signed)
Sent patient a Clinical cytogeneticist message about her Guardant Reveal results. Per Dr. Mosetta Putt the results are ctDNA not detected for they are negative.

## 2022-12-14 LAB — GUARDANT REVEAL

## 2022-12-20 NOTE — Progress Notes (Deleted)
Patient Care Team: Nestor Ramp, MD as PCP - General (Family Medicine) Malachy Mood, MD as Consulting Physician (Oncology) Jenel Lucks, MD as Consulting Physician (Gastroenterology) Romie Levee, MD as Consulting Physician (General Surgery) Malachy Mood, MD as Consulting Physician (Hematology)   I connected with Hoover Browns on 12/20/22 at  8:30 AM EDT by telephone visit and verified that I am speaking with the correct person using two identifiers.   I discussed the limitations, risks, security and privacy concerns of performing an evaluation and management service by telemedicine and the availability of in-person appointments. I also discussed with the patient that there may be a patient responsible charge related to this service. The patient expressed understanding and agreed to proceed.   Other persons participating in the visit and their role in the encounter: ***   Patient's location: ***  Provider's location: ***   Chief Complaint: Follow up GI symptoms   Oncology History Overview Note   Cancer Staging  Cancer of sigmoid colon [facility]) Staging form: Colon and Rectum, AJCC 8th Edition - Pathologic stage from 07/09/2022: Stage IIA (pT3, pN0, cM0) - Signed by Malachy Mood, MD on 07/28/2022 Total positive nodes: 0 Histologic grading system: 4 grade system Histologic grade (G): G1 Residual tumor (R): R0 - None     Cancer of sigmoid colon (HCC)  05/25/2022 Procedure   Colonoscopy by Dr. Tiajuana Amass - Likely malignant partially obstructing tumor in the recto-sigmoid colon. Biopsied. Tattooed. - One 14 mm polyp at the recto-sigmoid colon. This polyp was not removed due to it's very close proximity to the mass. - One 12 mm polyp in the ascending colon, removed with a cold snare. Resected and retrieved. - One 3 mm polyp in the descending colon, removed with a cold snare. Resected and retrieved. - The distal rectum and anal verge are normal on retroflexion view.    05/25/2022 Tumor Marker   CEA 3.4   06/02/2022 Imaging   CT CAP IMPRESSION: 1. Short segment asymmetric nodular wall thickening of the distal sigmoid colon/proximal rectum, likely reflecting patient's known primary colonic neoplasm. 2. No evidence of metastatic disease within the chest, abdomen or pelvis. 3. Nonobstructive punctate bilateral renal stones. 4. Enlarged uterus with multiple intrauterine masses measuring up to 5.1 cm, compatible with uterine leiomyomas.     06/03/2022 Initial Diagnosis   Rectosigmoid cancer (HCC)    Initial Biopsy   1. Colon, polyp(s), Ascending, Descending, x 2 - TUBULAR ADENOMA(S). - NO HIGH GRADE DYSPLASIA OR MALIGNANCY. 2. Rectosigmoid , biopsies - INVASIVE ADENOCARCINOMA, MODERATELY DIFFERENTIATED (SEE NOTE)   07/09/2022 Cancer Staging   Staging form: Colon and Rectum, AJCC 8th Edition - Pathologic stage from 07/09/2022: Stage IIA (pT3, pN0, cM0) - Signed by Malachy Mood, MD on 07/28/2022 Total positive nodes: 0 Histologic grading system: 4 grade system Histologic grade (G): G1 Residual tumor (R): R0 - None   07/09/2022 Pathology Results    FINAL MICROSCOPIC DIAGNOSIS:   A. COLON, RECTOSIGMOID, RESECTION:  -  Invasive well to moderately differentiated adenocarcinoma extending  through the mucosa, submucosa and into pericolonic soft tissue (6.1 cm  in greatest dimension)  -  Margins negative  pT3 pN0; see synoptic report for further information   B. FINAL DISTAL MARGIN:  -  Unremarkable colon, negative for malignancy.   C. UTERINE FIBROID, EXCISION:  -  Benign leiomyoma.       CURRENT THERAPY: Colon cancer surveillance   INTERVAL HISTORY Ms. Hausknecht returns for follow up as scheduled. Last  seen by me 11/23/22, at which time she reported postprandial nausea/bloating and small hard stools. Guardant reveal from that day was negative/not detected.   ROS   Past Medical History:  Diagnosis Date   Hypertension      Past Surgical  History:  Procedure Laterality Date   TUBAL LIGATION  2000     Outpatient Encounter Medications as of 12/21/2022  Medication Sig   amLODipine (NORVASC) 2.5 MG tablet Take 1 tablet (2.5 mg total) by mouth at bedtime.   fluticasone (FLONASE) 50 MCG/ACT nasal spray Place 2 sprays into both nostrils daily. (Patient taking differently: Place 2 sprays into both nostrils daily as needed for allergies (Summer months).)   No facility-administered encounter medications on file as of 12/21/2022.     There were no vitals filed for this visit. There is no height or weight on file to calculate BMI.   PHYSICAL EXAM GENERAL:alert, no distress and comfortable SKIN: no rash  EYES: sclera clear NECK: without mass LYMPH:  no palpable cervical or supraclavicular lymphadenopathy  LUNGS: clear with normal breathing effort HEART: regular rate & rhythm, no lower extremity edema ABDOMEN: abdomen soft, non-tender and normal bowel sounds NEURO: alert & oriented x 3 with fluent speech, no focal motor/sensory deficits Breast exam:  PAC without erythema    CBC    Component Value Date/Time   WBC 7.8 11/23/2022 0852   WBC 9.1 07/11/2022 0509   RBC 4.19 11/23/2022 0852   HGB 12.6 11/23/2022 0852   HCT 37.7 11/23/2022 0852   PLT 289 11/23/2022 0852   MCV 90.0 11/23/2022 0852   MCH 30.1 11/23/2022 0852   MCHC 33.4 11/23/2022 0852   RDW 12.6 11/23/2022 0852   LYMPHSABS 1.7 11/23/2022 0852   MONOABS 0.4 11/23/2022 0852   EOSABS 1.5 (H) 11/23/2022 0852   BASOSABS 0.1 11/23/2022 0852     CMP     Component Value Date/Time   NA 141 11/23/2022 0852   NA 138 01/08/2021 1059   K 3.7 11/23/2022 0852   CL 108 11/23/2022 0852   CO2 28 11/23/2022 0852   GLUCOSE 81 11/23/2022 0852   BUN 16 11/23/2022 0852   BUN 9 01/08/2021 1059   CREATININE 0.81 11/23/2022 0852   CREATININE 1.01 01/15/2016 0854   CALCIUM 9.2 11/23/2022 0852   PROT 7.9 11/23/2022 0852   PROT 7.5 01/08/2021 1059   ALBUMIN 4.2 11/23/2022  0852   ALBUMIN 4.2 01/08/2021 1059   AST 21 11/23/2022 0852   ALT 16 11/23/2022 0852   ALKPHOS 80 11/23/2022 0852   BILITOT 0.3 11/23/2022 0852   GFRNONAA >60 11/23/2022 0852   GFRNONAA 66 01/15/2016 0854   GFRAA 77 01/15/2016 0854     ASSESSMENT & PLAN:52 year old female   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  First colonoscopy showed partially obstructing rectosigmoid tumor, path confirmed G2 invasive adenocarcinoma.  -Baseline CEA normal, 3.4 -Staging CT CAP negative for distant metastasis or local regional adenopathy  -S/p hemicolectomy 07/09/2022, path showed T3 N0 lesion with no high risk features.  No adjuvant chemo recommended for her stage II colon cancer which was removed -ctDNA at +5 and +20 weeks postop were not detected -11/23/22 reported postprandial nausea and bloating which I feel is related to constipation. The CEA and ctDNA were negative/normal that day   2. Paresthesia in fingertips of left hand -Onset ~1 month; no injury or precipitating event. No  known DM or cervical arthritis. No chemo exposure.  -Exam showed mildly decreased peripheral vibratory sense on left fingertips -B12/MMA normal -She saw PCP who recommended splint    3. HTN -on amlodipine      PLAN:  No orders of the defined types were placed in this encounter.     All questions were answered. The patient knows to call the clinic with any problems, questions or concerns. No barriers to learning were detected. I spent *** counseling the patient face to face. The total time spent in the appointment was *** and more than 50% was on counseling, review of test results, and coordination of care.   Santiago Glad, NP-C @DATE @

## 2022-12-21 ENCOUNTER — Other Ambulatory Visit: Payer: Self-pay | Admitting: Nurse Practitioner

## 2022-12-21 ENCOUNTER — Inpatient Hospital Stay: Payer: BC Managed Care – PPO | Admitting: Nurse Practitioner

## 2022-12-21 ENCOUNTER — Telehealth: Payer: Self-pay | Admitting: Nurse Practitioner

## 2022-12-21 DIAGNOSIS — C187 Malignant neoplasm of sigmoid colon: Secondary | ICD-10-CM

## 2022-12-21 NOTE — Telephone Encounter (Signed)
Late entry: I called Ms. Kaminer 6/16 to reschedule our phone visit due to a conflict on my end. We spoke then. Since her visit on 5/20 she added benefiber and miralax in warm liquids. Bowels moving regularly now. Still has some bloating but much better, intermittent now. No nausea, pain, or other complaints. Denies unintentional weight loss. Overall she feels great.  We reviewed recent labs, with normal CEA and no detected ctDNA.   Will keep same plan for lab (CBC, CMP, CEA, next guardant reveal) and surveillance CT in August, and f/up in September. She agrees with the plan and appreciates the call. No further needs at this time.   Santiago Glad, NP

## 2023-02-08 ENCOUNTER — Telehealth: Payer: Self-pay

## 2023-02-08 ENCOUNTER — Other Ambulatory Visit: Payer: Self-pay

## 2023-02-08 NOTE — Telephone Encounter (Signed)
Spoke w/pt to inform pt that her Guardant Reveal is coming due.  Pt is aware and confirmed appt date and time on 02/23/2023.  Pt had no further questions or concerns.

## 2023-02-19 ENCOUNTER — Telehealth: Payer: Self-pay | Admitting: Hematology

## 2023-02-19 ENCOUNTER — Other Ambulatory Visit: Payer: Self-pay

## 2023-02-23 ENCOUNTER — Other Ambulatory Visit: Payer: BC Managed Care – PPO

## 2023-02-26 ENCOUNTER — Other Ambulatory Visit: Payer: Self-pay

## 2023-03-04 ENCOUNTER — Other Ambulatory Visit: Payer: Self-pay | Admitting: Family Medicine

## 2023-03-29 ENCOUNTER — Other Ambulatory Visit: Payer: BC Managed Care – PPO

## 2023-03-29 ENCOUNTER — Ambulatory Visit: Payer: BC Managed Care – PPO | Admitting: Hematology

## 2023-05-18 ENCOUNTER — Telehealth: Payer: Self-pay | Admitting: Hematology

## 2023-05-24 ENCOUNTER — Other Ambulatory Visit: Payer: BC Managed Care – PPO

## 2023-05-25 ENCOUNTER — Other Ambulatory Visit: Payer: BC Managed Care – PPO

## 2023-05-29 ENCOUNTER — Other Ambulatory Visit: Payer: Self-pay | Admitting: Family Medicine

## 2023-06-21 ENCOUNTER — Other Ambulatory Visit: Payer: BC Managed Care – PPO

## 2023-06-21 ENCOUNTER — Ambulatory Visit: Payer: BC Managed Care – PPO | Admitting: Hematology

## 2023-06-22 ENCOUNTER — Ambulatory Visit: Payer: BC Managed Care – PPO | Admitting: Hematology

## 2023-06-22 ENCOUNTER — Other Ambulatory Visit: Payer: BC Managed Care – PPO

## 2023-06-25 ENCOUNTER — Other Ambulatory Visit: Payer: Self-pay | Admitting: Family Medicine

## 2023-06-25 DIAGNOSIS — Z1231 Encounter for screening mammogram for malignant neoplasm of breast: Secondary | ICD-10-CM

## 2023-07-02 ENCOUNTER — Inpatient Hospital Stay: Payer: BC Managed Care – PPO | Attending: Hematology

## 2023-07-02 DIAGNOSIS — C19 Malignant neoplasm of rectosigmoid junction: Secondary | ICD-10-CM | POA: Diagnosis not present

## 2023-07-02 DIAGNOSIS — C187 Malignant neoplasm of sigmoid colon: Secondary | ICD-10-CM

## 2023-07-02 LAB — CBC WITH DIFFERENTIAL (CANCER CENTER ONLY)
Abs Immature Granulocytes: 0.06 10*3/uL (ref 0.00–0.07)
Basophils Absolute: 0.1 10*3/uL (ref 0.0–0.1)
Basophils Relative: 1 %
Eosinophils Absolute: 0.5 10*3/uL (ref 0.0–0.5)
Eosinophils Relative: 5 %
HCT: 36.9 % (ref 36.0–46.0)
Hemoglobin: 12.5 g/dL (ref 12.0–15.0)
Immature Granulocytes: 1 %
Lymphocytes Relative: 26 %
Lymphs Abs: 2.7 10*3/uL (ref 0.7–4.0)
MCH: 30 pg (ref 26.0–34.0)
MCHC: 33.9 g/dL (ref 30.0–36.0)
MCV: 88.5 fL (ref 80.0–100.0)
Monocytes Absolute: 0.7 10*3/uL (ref 0.1–1.0)
Monocytes Relative: 6 %
Neutro Abs: 6.5 10*3/uL (ref 1.7–7.7)
Neutrophils Relative %: 61 %
Platelet Count: 327 10*3/uL (ref 150–400)
RBC: 4.17 MIL/uL (ref 3.87–5.11)
RDW: 13.1 % (ref 11.5–15.5)
WBC Count: 10.4 10*3/uL (ref 4.0–10.5)
nRBC: 0 % (ref 0.0–0.2)

## 2023-07-02 LAB — CMP (CANCER CENTER ONLY)
ALT: 13 U/L (ref 0–44)
AST: 16 U/L (ref 15–41)
Albumin: 4.5 g/dL (ref 3.5–5.0)
Alkaline Phosphatase: 87 U/L (ref 38–126)
Anion gap: 8 (ref 5–15)
BUN: 16 mg/dL (ref 6–20)
CO2: 27 mmol/L (ref 22–32)
Calcium: 10.1 mg/dL (ref 8.9–10.3)
Chloride: 104 mmol/L (ref 98–111)
Creatinine: 0.93 mg/dL (ref 0.44–1.00)
GFR, Estimated: >60 mL/min (ref >60)
Glucose, Bld: 89 mg/dL (ref 70–99)
Potassium: 4.1 mmol/L (ref 3.5–5.1)
Sodium: 139 mmol/L (ref 135–145)
Total Bilirubin: 0.3 mg/dL (ref <1.2)
Total Protein: 8.4 g/dL — ABNORMAL HIGH (ref 6.5–8.1)

## 2023-07-05 LAB — CEA (ACCESS): CEA (CHCC): 1.06 ng/mL (ref 0.00–5.00)

## 2023-07-07 NOTE — Assessment & Plan Note (Signed)
-  pT3N0M0, stage II, MSS, G1 -She was not interested in November 2023 by colonoscopy for intermittent hematochezia. -She underwent hemicolectomy on July 09, 2022.  I reviewed her surgical pathology, which showed well to moderately differentiated adenocarcinoma, T3, or 20 lymph nodes were negative, margins were negative, no lymphovascular invasion or perineural invasion. -We discussed that if she had a stage II colon cancer, does not have high risk features, so I do not recommend adjuvant chemotherapy. -We discussed her low to moderate risk of recurrence, especially in the first 2 to 3 years.  I recommend cancer surveillance with routine lab, including CEA, physical exam, and CT scan every year for 2 to 3 years.  She will have a repeat colonoscopy in January 2025. -We discussed the role of circulating tumor DNA in cancer surveillance, she would like to proceed, I ordered GuardantREveal to be done every 3 months X3

## 2023-07-08 ENCOUNTER — Encounter: Payer: Self-pay | Admitting: Gastroenterology

## 2023-07-08 ENCOUNTER — Inpatient Hospital Stay: Payer: BC Managed Care – PPO | Attending: Hematology | Admitting: Hematology

## 2023-07-08 ENCOUNTER — Encounter: Payer: Self-pay | Admitting: Hematology

## 2023-07-08 ENCOUNTER — Inpatient Hospital Stay: Payer: BC Managed Care – PPO

## 2023-07-08 VITALS — BP 138/78 | HR 73 | Temp 98.1°F | Resp 18 | Wt 205.9 lb

## 2023-07-08 DIAGNOSIS — Z79899 Other long term (current) drug therapy: Secondary | ICD-10-CM | POA: Diagnosis not present

## 2023-07-08 DIAGNOSIS — I10 Essential (primary) hypertension: Secondary | ICD-10-CM | POA: Insufficient documentation

## 2023-07-08 DIAGNOSIS — Z713 Dietary counseling and surveillance: Secondary | ICD-10-CM | POA: Diagnosis not present

## 2023-07-08 DIAGNOSIS — C19 Malignant neoplasm of rectosigmoid junction: Secondary | ICD-10-CM | POA: Insufficient documentation

## 2023-07-08 DIAGNOSIS — Z7182 Exercise counseling: Secondary | ICD-10-CM | POA: Insufficient documentation

## 2023-07-08 DIAGNOSIS — C187 Malignant neoplasm of sigmoid colon: Secondary | ICD-10-CM

## 2023-07-08 DIAGNOSIS — K5900 Constipation, unspecified: Secondary | ICD-10-CM | POA: Insufficient documentation

## 2023-07-08 DIAGNOSIS — N951 Menopausal and female climacteric states: Secondary | ICD-10-CM | POA: Diagnosis not present

## 2023-07-08 NOTE — Progress Notes (Signed)
 [facility]   Telephone:([phone]) [phone] Fax:(336) [phone]   Clinic Follow up Note   Patient Care Team: Rosalynn Camie CROME, MD as PCP - General (Family Medicine) Lanny Callander, MD as Consulting Physician (Oncology) Stacia Glendia BRAVO, MD as Consulting Physician (Gastroenterology) Debby Hila, MD as Consulting Physician (General Surgery) Lanny Callander, MD as Consulting Physician (Hematology)  Date of Service:  07/08/2023  CHIEF COMPLAINT: f/u of colon cancer  CURRENT THERAPY:  Surveillance  Oncology History   Cancer of sigmoid colon (HCC) -pT3N0M0, stage II, MSS, G1 -She was not interested in November 2023 by colonoscopy for intermittent hematochezia. -She underwent hemicolectomy on July 09, 2022.  I reviewed her surgical pathology, which showed well to moderately differentiated adenocarcinoma, T3, or 20 lymph nodes were negative, margins were negative, no lymphovascular invasion or perineural invasion. -We discussed that if she had a stage II colon cancer, does not have high risk features, so I do not recommend adjuvant chemotherapy. -We discussed her low to moderate risk of recurrence, especially in the first 2 to 3 years.  I recommend cancer surveillance with routine lab, including CEA, physical exam, and CT scan every year for 2 to 3 years.  She will have a repeat colonoscopy in January 2025. -We discussed the role of circulating tumor DNA in cancer surveillance, she would like to proceed, I ordered GuardantREveal to be done every 3-4 months for 2 years     Assessment and Plan    Colon Cancer Follow-up 53 year old with stage II colon cancer, well to moderate differentiation, negative lymph nodes. No symptoms of recurrence. Normal CEA. Slightly elevated protein (8.4 g/dL) not indicative of multiple myeloma. Discussed 20% recurrence rate for similar cases. Emphasized follow-up colonoscopy and CT scan for surveillance. - Order CT scan in 2-3 weeks - Schedule colonoscopy with  Dr. Stacia - Repeat protein level test at next visit - Follow up in 4 months  Constipation Intermittent constipation managed with Miralax . No current gastrointestinal complaints. - Continue Miralax  as needed  Menopause Reports 5-7 pound weight gain and hot flashes. Advised on dietary and exercise recommendations to manage weight. - Advise low-carb, high-protein diet - Encourage regular exercise  Hypertension Well-managed with amlodipine  2.5 mg. No issues with blood pressure control. - Continue amlodipine  2.5 mg  General Health Maintenance Discussed lifestyle modifications to reduce cancer recurrence risk and manage weight. Emphasized healthy diet with less red meat, more lean meats, vegetables, and fruits. - Encourage healthy diet with less red meat, more lean meats, vegetables, and fruits - Advise weight management and regular exercise  Plan -Patient is clinically doing well, lab reviewed, no concern for recurrence. -Surveillance CT scan in the next 2 to 3 weeks, will call with results -She will call Dr. Fredrica office for repeating colonoscopy - Follow up in 4 months.         SUMMARY OF ONCOLOGIC HISTORY: Oncology History Overview Note   Cancer Staging  Cancer of sigmoid colon [facility]) Staging form: Colon and Rectum, AJCC 8th Edition - Pathologic stage from 07/09/2022: Stage IIA (pT3, pN0, cM0) - Signed by Lanny Callander, MD on 07/28/2022 Total positive nodes: 0 Histologic grading system: 4 grade system Histologic grade (G): G1 Residual tumor (R): R0 - None     Cancer of sigmoid colon (HCC)  05/25/2022 Procedure   Colonoscopy by Dr. Glendia Stacia - Likely malignant partially obstructing tumor in the recto-sigmoid colon. Biopsied. Tattooed. - One 14 mm polyp at the recto-sigmoid colon. This polyp was not removed due to  it's very close proximity to the mass. - One 12 mm polyp in the ascending colon, removed with a cold snare. Resected and retrieved. - One 3 mm polyp  in the descending colon, removed with a cold snare. Resected and retrieved. - The distal rectum and anal verge are normal on retroflexion view.   05/25/2022 Tumor Marker   CEA 3.4   06/02/2022 Imaging   CT CAP IMPRESSION: 1. Short segment asymmetric nodular wall thickening of the distal sigmoid colon/proximal rectum, likely reflecting patient's known primary colonic neoplasm. 2. No evidence of metastatic disease within the chest, abdomen or pelvis. 3. Nonobstructive punctate bilateral renal stones. 4. Enlarged uterus with multiple intrauterine masses measuring up to 5.1 cm, compatible with uterine leiomyomas.     06/03/2022 Initial Diagnosis   Rectosigmoid cancer (HCC)    Initial Biopsy   1. Colon, polyp(s), Ascending, Descending, x 2 - TUBULAR ADENOMA(S). - NO HIGH GRADE DYSPLASIA OR MALIGNANCY. 2. Rectosigmoid , biopsies - INVASIVE ADENOCARCINOMA, MODERATELY DIFFERENTIATED (SEE NOTE)   07/09/2022 Cancer Staging   Staging form: Colon and Rectum, AJCC 8th Edition - Pathologic stage from 07/09/2022: Stage IIA (pT3, pN0, cM0) - Signed by Lanny Callander, MD on 07/28/2022 Total positive nodes: 0 Histologic grading system: 4 grade system Histologic grade (G): G1 Residual tumor (R): R0 - None   07/09/2022 Pathology Results    FINAL MICROSCOPIC DIAGNOSIS:   A. COLON, RECTOSIGMOID, RESECTION:  -  Invasive well to moderately differentiated adenocarcinoma extending  through the mucosa, submucosa and into pericolonic soft tissue (6.1 cm  in greatest dimension)  -  Margins negative  pT3 pN0; see synoptic report for further information   B. FINAL DISTAL MARGIN:  -  Unremarkable colon, negative for malignancy.   C. UTERINE FIBROID, EXCISION:  -  Benign leiomyoma.       Discussed the use of AI scribe software for clinical note transcription with the patient, who gave verbal consent to proceed.  History of Present Illness   The patient, a 53 year old with a history of colon cancer,  presents for a routine follow-up. She reports a recent weight gain, which she attributes to menopause. She denies any stomach issues, bloating, or pain. Her bowel movements are generally regular, although she occasionally uses Miralax  for constipation. She is currently taking Vitamin D  and amlodipine  2.5mg  for blood pressure control.  The patient expresses concern about a slightly elevated protein level in her blood, which she discovered during her own review of her medical records. She denies any significant changes in her diet, specifically denying a high protein diet or protein supplements. She also expresses anxiety about the possibility of cancer recurrence, despite reassurances from the doctor that the elevated protein level is not likely related to cancer.  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         All other systems were reviewed with the patient and are negative.  MEDICAL HISTORY:  Past Medical History:  Diagnosis Date   Hypertension     SURGICAL HISTORY: Past Surgical History:  Procedure Laterality Date   TUBAL LIGATION  2000    I have reviewed the social history and family history with the patient and they are unchanged from previous note.  ALLERGIES:  is allergic to lisinopril .  MEDICATIONS:  Current Outpatient Medications  Medication Sig Dispense Refill   amLODipine  (NORVASC ) 2.5 MG tablet TAKE 1 TABLET BY MOUTH AT BEDTIME 90 tablet 0   fluticasone  (FLONASE ) 50 MCG/ACT  nasal spray Place 2 sprays into both nostrils daily. (Patient taking differently: Place 2 sprays into both nostrils daily as needed for allergies (Summer months).) 16 g 12   No current facility-administered medications for this visit.    PHYSICAL EXAMINATION: ECOG PERFORMANCE STATUS: 0 - Asymptomatic  Vitals:   07/08/23 1416 07/08/23 1417  BP: (!) 150/79 138/78  Pulse: 73   Resp: 18   Temp: 98.1 F (36.7 C)   SpO2: 99%     Wt Readings from Last 3 Encounters:  07/08/23 205 lb 14.4 oz (93.4 kg)  11/27/22 190 lb 3.2 oz (86.3 kg)  11/23/22 190 lb 4.8 oz (86.3 kg)     GENERAL:alert, no distress and comfortable SKIN: skin color, texture, turgor are normal, no rashes or significant lesions EYES: normal, Conjunctiva are pink and non-injected, sclera clear NECK: supple, thyroid normal size, non-tender, without nodularity LYMPH:  no palpable lymphadenopathy in the cervical, axillary  LUNGS: clear to auscultation and percussion with normal breathing effort HEART: regular rate & rhythm and no murmurs and no lower extremity edema ABDOMEN:abdomen soft, non-tender and normal bowel sounds Musculoskeletal:no cyanosis of digits and no clubbing  NEURO: alert & oriented x 3 with fluent speech, no focal motor/sensory deficits   LABORATORY DATA:  I have reviewed the data as listed    Latest Ref Rng & Units 07/02/2023    3:41 PM 11/23/2022    8:52 AM 07/11/2022    5:09 AM  CBC  WBC 4.0 - 10.5 K/uL 10.4  7.8  9.1   Hemoglobin 12.0 - 15.0 g/dL 87.4  87.3  89.6   Hematocrit 36.0 - 46.0 % 36.9  37.7  32.6   Platelets 150 - 400 K/uL 327  289  259         Latest Ref Rng & Units 07/02/2023    3:41 PM 11/23/2022    8:52 AM 07/11/2022    5:09 AM  CMP  Glucose 70 - 99 mg/dL 89  81  898   BUN 6 - 20 mg/dL 16  16  11    Creatinine 0.44 - 1.00 mg/dL 9.06  9.18  9.21   Sodium 135 - 145 mmol/L 139  141  141   Potassium 3.5 - 5.1 mmol/L 4.1  3.7  3.5   Chloride 98 - 111 mmol/L 104  108  107   CO2 22 - 32 mmol/L 27  28  26    Calcium 8.9 - 10.3 mg/dL 89.8  9.2  8.5   Total Protein 6.5 - 8.1 g/dL 8.4  7.9    Total Bilirubin <1.2 mg/dL 0.3  0.3    Alkaline Phos 38 - 126 U/L 87  80    AST 15 - 41 U/L 16  21    ALT 0 - 44 U/L 13  16        RADIOGRAPHIC STUDIES: I have personally reviewed the radiological images as listed and agreed with the findings in the report. No results found.    Orders Placed This Encounter   Procedures   CT CHEST ABDOMEN PELVIS W CONTRAST    Standing Status:   Future    Expected Date:   07/22/2023    Expiration Date:   07/07/2024    If indicated for the ordered procedure, I authorize the administration of contrast media per Radiology protocol:   Yes    Does the patient have a contrast media/X-ray dye allergy?:   No    Is patient pregnant?:   No  Preferred imaging location?:   [facility]    If indicated for the ordered procedure, I authorize the administration of oral contrast media per Radiology protocol:   Yes   All questions were answered. The patient knows to call the clinic with any problems, questions or concerns. No barriers to learning was detected. The total time spent in the appointment was 25 minutes.     Onita Mattock, MD 07/08/2023

## 2023-07-12 DIAGNOSIS — C189 Malignant neoplasm of colon, unspecified: Secondary | ICD-10-CM | POA: Diagnosis not present

## 2023-07-12 DIAGNOSIS — C187 Malignant neoplasm of sigmoid colon: Secondary | ICD-10-CM | POA: Diagnosis not present

## 2023-07-13 ENCOUNTER — Encounter: Payer: Self-pay | Admitting: Hematology

## 2023-07-14 ENCOUNTER — Telehealth: Payer: Self-pay

## 2023-07-14 LAB — GUARDANT REVEAL

## 2023-07-14 NOTE — Telephone Encounter (Signed)
 Pt advised with VU that Guardant results were "not detected"

## 2023-07-15 ENCOUNTER — Encounter: Payer: Self-pay | Admitting: Hematology

## 2023-07-19 ENCOUNTER — Ambulatory Visit
Admission: RE | Admit: 2023-07-19 | Discharge: 2023-07-19 | Disposition: A | Discharge status: Home / Self Care | Payer: BC Managed Care – PPO | Source: Ambulatory Visit | Attending: Family Medicine | Admitting: Family Medicine

## 2023-07-19 DIAGNOSIS — Z1231 Encounter for screening mammogram for malignant neoplasm of breast: Secondary | ICD-10-CM | POA: Diagnosis not present

## 2023-07-21 ENCOUNTER — Other Ambulatory Visit (HOSPITAL_COMMUNITY)
Admission: RE | Admit: 2023-07-21 | Discharge: 2023-07-21 | Disposition: A | Discharge status: Home / Self Care | Payer: BC Managed Care – PPO | Source: Ambulatory Visit | Attending: Family Medicine | Admitting: Family Medicine

## 2023-07-21 ENCOUNTER — Encounter: Payer: Self-pay | Admitting: Family Medicine

## 2023-07-21 ENCOUNTER — Ambulatory Visit (INDEPENDENT_AMBULATORY_CARE_PROVIDER_SITE_OTHER): Payer: BC Managed Care – PPO | Admitting: Family Medicine

## 2023-07-21 VITALS — BP 132/80 | HR 98 | Ht 62.0 in | Wt 205.4 lb

## 2023-07-21 DIAGNOSIS — I10 Essential (primary) hypertension: Secondary | ICD-10-CM

## 2023-07-21 DIAGNOSIS — Z0000 Encounter for general adult medical examination without abnormal findings: Secondary | ICD-10-CM | POA: Insufficient documentation

## 2023-07-21 DIAGNOSIS — C187 Malignant neoplasm of sigmoid colon: Secondary | ICD-10-CM

## 2023-07-21 DIAGNOSIS — N951 Menopausal and female climacteric states: Secondary | ICD-10-CM

## 2023-07-21 NOTE — Patient Instructions (Signed)
 I will send you a note about your Pap smear and your other labs.  If the protein in your blood is still elevated, I will contact you about further evaluation.  I really think this is probably nothing so please try not to worry.  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  Best of luck to you.  I would probably plan on seeing you back in 6 to 12 months just for follow-up.  Certainly sooner if something happens or comes up.

## 2023-07-22 ENCOUNTER — Encounter: Payer: Self-pay | Admitting: Family Medicine

## 2023-07-22 LAB — LIPID PANEL
Chol/HDL Ratio: 4 {ratio} (ref 0.0–4.4)
Cholesterol, Total: 219 mg/dL — ABNORMAL HIGH (ref 100–199)
HDL: 55 mg/dL (ref >39)
LDL Chol Calc (NIH): 137 mg/dL — ABNORMAL HIGH (ref 0–99)
Triglycerides: 149 mg/dL (ref 0–149)
VLDL Cholesterol Cal: 27 mg/dL (ref 5–40)

## 2023-07-22 LAB — COMPREHENSIVE METABOLIC PANEL
ALT: 16 [IU]/L (ref 0–32)
AST: 17 [IU]/L (ref 0–40)
Albumin: 4.4 g/dL (ref 3.8–4.9)
Alkaline Phosphatase: 108 [IU]/L (ref 44–121)
BUN/Creatinine Ratio: 14 (ref 9–23)
BUN: 11 mg/dL (ref 6–24)
Bilirubin Total: 0.3 mg/dL (ref 0.0–1.2)
CO2: 19 mmol/L — ABNORMAL LOW (ref 20–29)
Calcium: 9.8 mg/dL (ref 8.7–10.2)
Chloride: 104 mmol/L (ref 96–106)
Creatinine, Ser: 0.77 mg/dL (ref 0.57–1.00)
Globulin, Total: 3.2 g/dL (ref 1.5–4.5)
Glucose: 98 mg/dL (ref 70–99)
Potassium: 4.1 mmol/L (ref 3.5–5.2)
Sodium: 141 mmol/L (ref 134–144)
Total Protein: 7.6 g/dL (ref 6.0–8.5)
eGFR: 91 mL/min/{1.73_m2} (ref >59)

## 2023-07-22 LAB — VITAMIN D 25 HYDROXY (VIT D DEFICIENCY, FRACTURES): Vit D, 25-Hydroxy: 39.2 ng/mL (ref 30.0–100.0)

## 2023-07-22 NOTE — Assessment & Plan Note (Signed)
Blood pressure well-controlled.  Will continue amlodipine 5 mg daily.  Check some labs today.

## 2023-07-22 NOTE — Assessment & Plan Note (Signed)
Having less problems with hot flashes and mood lability right now.  Not sure why but she is happy about that.

## 2023-07-22 NOTE — Assessment & Plan Note (Signed)
Continuing to follow regularly with oncology.  Has upcoming colonoscopy repeat scheduled.  Asymptomatic.

## 2023-07-22 NOTE — Assessment & Plan Note (Signed)
Up-to-date on health maintenance with mammogram scheduled and colonoscopy upcoming.  Labs today.  Reviewed exercise and healthy eating.  Perimenopausal and if her symptoms recur more severely, she will return to clinic.  Given her chronic issues, I would probably recommend seeing her in 6 to 12 months and certainly sooner with problems.  Repeat Pap today and I will notify her of results.

## 2023-07-22 NOTE — Progress Notes (Signed)
    CHIEF COMPLAINT / HPI:  Well adult check up Pap in 2022 was without transformation zone but negative for hi risk HPV She is ok with doing follow up pap today Mammogram scheduled as is f/u colonoscopay. She is concerned about some blood work she had drawn at the oncologist office.  Her total protein was slightly elevated.  She wants to know if this is concerning for what we should do about it.  Overall she feels well.  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  She continues in full-time job and is happy in that but thinks may be something new might be on the horizon.  Follows with oncology.  Says she still trying to get her head wrapped around the fact that she had colorectal cancer.  Mood is good.  Energy level is good.     PERTINENT  PMH / PSH: I have reviewed the patient's medications, allergies, past medical and surgical history, smoking status and updated in the EMR as appropriate.   OBJECTIVE:  BP 132/80   Pulse 98   Ht 5\' 2"  (1.575 m)   Wt 205 lb 6.4 oz (93.2 kg)   SpO2 100%   BMI 37.57 kg/m   Vital signs reviewed GENERALl: Well developed, well nourished, in no acute distress. HEENT: PERRLA, EOMI, sclerae are nonicteric NECK: Supple, FROM, without lymphadenopathy.  THYROID: normal without nodularity CAROTID ARTERIES: without bruits LUNGS: clear to auscultation bilaterally. No wheezes or rales. Normal respiratory effort HEART: Regular rate and rhythm, no murmurs. Distal pulses are bilaterally symmetrical, 2+. ABDOMEN: soft with positive bowel sounds. No masses noted MSK: MOE x 4. Normal muscle strength, bulk and tone. SKIN no rash. Normal temperature. NEURO: no focal deficits. Normal gait. Normal balance. GU normal external genitalia.  Bimanual reveals no unusual masses.  Pap smear is obtained.  ASSESSMENT / PLAN:   Cancer of sigmoid colon (HCC) Continuing to follow regularly with  oncology.  Has upcoming colonoscopy repeat scheduled.  Asymptomatic.  Essential hypertension, benign Blood pressure well-controlled.  Will continue amlodipine 5 mg daily.  Check some labs today.  Perimenopausal symptoms Having less problems with hot flashes and mood lability right now.  Not sure why but she is happy about that.  Well adult exam Up-to-date on health maintenance with mammogram scheduled and colonoscopy upcoming.  Labs today.  Reviewed exercise and healthy eating.  Perimenopausal and if her symptoms recur more severely, she will return to clinic.  Given her chronic issues, I would probably recommend seeing her in 6 to 12 months and certainly sooner with problems.  Repeat Pap today and I will notify her of results.   Denny Levy MD

## 2023-07-23 ENCOUNTER — Encounter: Payer: Self-pay | Admitting: Family Medicine

## 2023-07-23 LAB — CYTOLOGY - PAP
Comment: NEGATIVE
High risk HPV: NEGATIVE

## 2023-07-29 ENCOUNTER — Ambulatory Visit (HOSPITAL_COMMUNITY): Payer: BC Managed Care – PPO

## 2023-07-29 ENCOUNTER — Encounter: Payer: Self-pay | Admitting: *Deleted

## 2023-07-29 NOTE — Progress Notes (Unsigned)
Notified by scheduling patient refuses to schedule CT Scan due to out of pocket expenses. CT is not covered by insurance. CT Order is cancelled.

## 2023-08-10 ENCOUNTER — Other Ambulatory Visit: Payer: Self-pay

## 2023-08-23 ENCOUNTER — Encounter: Payer: BC Managed Care – PPO | Admitting: Gastroenterology

## 2023-09-11 ENCOUNTER — Other Ambulatory Visit: Payer: Self-pay | Admitting: Family Medicine

## 2023-09-20 ENCOUNTER — Other Ambulatory Visit: Payer: Self-pay

## 2023-11-08 ENCOUNTER — Other Ambulatory Visit: Payer: BC Managed Care – PPO

## 2023-11-08 ENCOUNTER — Ambulatory Visit: Payer: BC Managed Care – PPO | Admitting: Hematology

## 2024-02-08 ENCOUNTER — Other Ambulatory Visit: Payer: Self-pay | Admitting: Family Medicine

## 2024-04-26 ENCOUNTER — Other Ambulatory Visit: Payer: Self-pay

## 2024-04-26 NOTE — Progress Notes (Signed)
 Tried to reach patient to set up a lab appointment to have her Guardant Reveal drawn, was not able to speak to anyone so I left a detailed message for her to call me back to set this app. Will also send her a Mychart message as well.

## 2024-05-16 ENCOUNTER — Other Ambulatory Visit: Payer: Self-pay

## 2024-05-16 DIAGNOSIS — C187 Malignant neoplasm of sigmoid colon: Secondary | ICD-10-CM

## 2024-05-18 ENCOUNTER — Other Ambulatory Visit: Payer: Self-pay

## 2024-05-18 DIAGNOSIS — C187 Malignant neoplasm of sigmoid colon: Secondary | ICD-10-CM

## 2024-05-22 ENCOUNTER — Other Ambulatory Visit: Payer: Self-pay

## 2024-05-22 ENCOUNTER — Ambulatory Visit (HOSPITAL_COMMUNITY): Payer: Self-pay

## 2024-05-22 ENCOUNTER — Inpatient Hospital Stay: Payer: Self-pay | Attending: Hematology

## 2024-05-22 DIAGNOSIS — C187 Malignant neoplasm of sigmoid colon: Secondary | ICD-10-CM

## 2024-05-24 ENCOUNTER — Ambulatory Visit (INDEPENDENT_AMBULATORY_CARE_PROVIDER_SITE_OTHER): Payer: Self-pay | Admitting: Family Medicine

## 2024-05-24 ENCOUNTER — Encounter: Payer: Self-pay | Admitting: Family Medicine

## 2024-05-24 ENCOUNTER — Other Ambulatory Visit (HOSPITAL_COMMUNITY)
Admission: RE | Admit: 2024-05-24 | Discharge: 2024-05-24 | Disposition: A | Discharge status: Home / Self Care | Source: Ambulatory Visit | Attending: Family Medicine | Admitting: Family Medicine

## 2024-05-24 VITALS — BP 135/70 | HR 76 | Ht 62.0 in | Wt 206.8 lb

## 2024-05-24 DIAGNOSIS — I10 Essential (primary) hypertension: Secondary | ICD-10-CM

## 2024-05-24 DIAGNOSIS — Z124 Encounter for screening for malignant neoplasm of cervix: Secondary | ICD-10-CM | POA: Diagnosis present

## 2024-05-24 MED ORDER — AMLODIPINE BESYLATE 2.5 MG PO TABS: 2.5000 mg | ORAL_TABLET | Freq: Every day | ORAL | 3 refills | Status: AC

## 2024-05-26 ENCOUNTER — Ambulatory Visit: Payer: Self-pay | Admitting: Family Medicine

## 2024-05-26 NOTE — Progress Notes (Signed)
    CHIEF COMPLAINT / HPI:  Here for f/u HTN. No problems with meds Needs refills F/u abnormal pap smear Questions bout next steps 3. Continues f/u with colorectal surgeon but is off schedule for her colonoscopy due to some misunderstanding. No blood in stool Energy baseline  New job going well   PERTINENT  PMH / PSH: I have reviewed the patient's medications, allergies, past medical and surgical history, smoking status and updated in the EMR as appropriate.   OBJECTIVE:  BP 135/70   Pulse 76   Ht 5' 2 (1.575 m)   Wt 206 lb 12.8 oz (93.8 kg)   SpO2 100%   BMI 37.82 kg/m  GU externally normal Cervix normal Pap obtained Bimanual limited by habitus but no masses noted  ASSESSMENT / PLAN: Pap pending Refill meds for htn F/u with colonoscopy per colorectal  surgeon  No problem-specific Assessment & Plan notes found for this encounter.   Camie Mulch MD

## 2024-05-29 ENCOUNTER — Ambulatory Visit (HOSPITAL_COMMUNITY)
Admission: RE | Admit: 2024-05-29 | Discharge: 2024-05-29 | Disposition: A | Discharge status: Home / Self Care | Source: Ambulatory Visit | Attending: Hematology | Admitting: Hematology

## 2024-05-29 DIAGNOSIS — C187 Malignant neoplasm of sigmoid colon: Secondary | ICD-10-CM | POA: Insufficient documentation

## 2024-05-29 MED ORDER — IOHEXOL 300 MG/ML  SOLN
100.0000 mL | Freq: Once | INTRAMUSCULAR | Status: AC | PRN
  Administered 2024-05-29: 100 mL via INTRAVENOUS

## 2024-05-29 MED ORDER — SODIUM CHLORIDE (PF) 0.9 % IJ SOLN
INTRAMUSCULAR | Status: AC
  Filled 2024-05-29: qty 50

## 2024-05-29 MED ORDER — IOHEXOL 9 MG/ML PO SOLN
500.0000 mL | ORAL | Status: AC
  Administered 2024-05-29 (×2): 500 mL via ORAL

## 2024-05-30 ENCOUNTER — Encounter: Payer: Self-pay | Admitting: Hematology

## 2024-05-30 LAB — CYTOLOGY - PAP
Comment: NEGATIVE
Diagnosis: NEGATIVE
Diagnosis: REACTIVE
High risk HPV: NEGATIVE

## 2024-05-30 LAB — GUARDANT REVEAL

## 2024-06-01 ENCOUNTER — Ambulatory Visit: Payer: Self-pay | Admitting: Family Medicine

## 2024-06-01 NOTE — Progress Notes (Signed)
 Normal Next pap in 3 years notified by my chart and phone

## 2024-06-07 ENCOUNTER — Other Ambulatory Visit: Payer: Self-pay

## 2024-06-07 ENCOUNTER — Inpatient Hospital Stay: Admitting: Hematology

## 2024-06-07 NOTE — Progress Notes (Signed)
 As per Dr.Feng contacted pt to reply results of GuardantReveal. Pt results were negative . Pt voiced full understanding and would like to set up a telephone appt with Dr.feng to address her questions

## 2024-06-08 ENCOUNTER — Inpatient Hospital Stay: Attending: Hematology | Admitting: Hematology

## 2024-06-08 DIAGNOSIS — D259 Leiomyoma of uterus, unspecified: Secondary | ICD-10-CM | POA: Insufficient documentation

## 2024-06-08 DIAGNOSIS — Z79899 Other long term (current) drug therapy: Secondary | ICD-10-CM | POA: Insufficient documentation

## 2024-06-08 DIAGNOSIS — Z85038 Personal history of other malignant neoplasm of large intestine: Secondary | ICD-10-CM | POA: Insufficient documentation

## 2024-06-08 DIAGNOSIS — C187 Malignant neoplasm of sigmoid colon: Secondary | ICD-10-CM

## 2024-06-08 DIAGNOSIS — N281 Cyst of kidney, acquired: Secondary | ICD-10-CM | POA: Diagnosis not present

## 2024-06-08 DIAGNOSIS — Z780 Asymptomatic menopausal state: Secondary | ICD-10-CM | POA: Insufficient documentation

## 2024-06-08 DIAGNOSIS — R918 Other nonspecific abnormal finding of lung field: Secondary | ICD-10-CM | POA: Insufficient documentation

## 2024-06-08 DIAGNOSIS — K5900 Constipation, unspecified: Secondary | ICD-10-CM | POA: Diagnosis not present

## 2024-06-08 NOTE — Progress Notes (Signed)
 [facility]   Telephone:([phone]) [phone] Fax:([phone]) [phone]   Clinic Follow up Note   Patient Care Team: Sara Camie CROME, MD as PCP - General (Family Medicine) Sara Callander, MD as Consulting Physician (Oncology) Sara Glendia BRAVO, MD as Consulting Physician (Gastroenterology) Sara Hila, MD as Consulting Physician (General Surgery) Sara Callander, MD as Consulting Physician (Hematology) 06/08/2024  I connected with Sara Dorsey on 06/08/24 at  7:45 AM EST by telephone and verified that I am speaking with the correct person using two identifiers.   I discussed the limitations, risks, security and privacy concerns of performing an evaluation and management service by telephone and the availability of in person appointments. I also discussed with the patient that there may be a patient responsible charge related to this service. The patient expressed understanding and agreed to proceed.   Patient's location:  Home  Provider's location:  Office    CHIEF COMPLAINT: f/u colon cancer    CURRENT THERAPY: Cancer surveillance  Oncology history Cancer of sigmoid colon (HCC) -pT3N0M0, stage II, MSS, G1 -She was not interested in November 2023 by colonoscopy for intermittent hematochezia. -She underwent hemicolectomy on July 09, 2022.  I reviewed her surgical pathology, which showed well to moderately differentiated adenocarcinoma, T3, or 20 lymph nodes were negative, margins were negative, no lymphovascular invasion or perineural invasion. -We discussed that if she had a stage II colon cancer, does not have high risk features, so I do not recommend adjuvant chemotherapy. -We discussed her low to moderate risk of recurrence, especially in the first 2 to 3 years.  I recommend cancer surveillance with routine lab, including CEA, physical exam, and CT scan every year for 2 to 3 years.  She will have a repeat colonoscopy in January 2025. -We discussed the role of circulating tumor DNA  in cancer surveillance, she would like to proceed, I ordered GuardantREveal to be done every 3 months X3   Assessment & Plan Stage II colon cancer, status post resection, under surveillance Two years post-diagnosis with no evidence of recurrence. CT scan from November 2025 shows no concerns for recurrence. The third year post-diagnosis has a lower risk of recurrence. - Continue surveillance with CT scan in one year  - Schedule follow-up appointment in six months  Constipation, managed with Miralax  Constipation is well-managed with Miralax .  Stable multiple lung nodules Multiple lung nodules are stable since two years ago. Likely benign, possibly scar tissue from previous pneumonia or bronchitis. No smoking history. - Continue monitoring with CT scan in six months  Left renal cyst, increased in size, benign Left renal cyst increased from 1 cm to 2.5 cm. Benign and does not require removal. - Consider referral to urologist if concerned  Left kidney stone Small kidney stone in the left kidney, same side as the renal cyst. Sara Dorsey is large and may require urologist evaluation. - Discuss with primary care physician for referral to urologist - Avoid calcium supplementation  Uterine fibroid, postmenopausal, stable Uterine fibroid is stable and smaller post-menopause. No intervention required unless symptomatic.  Thoracic spine degenerative changes (spondylosis/arthritis) Degenerative changes in the thoracic spine consistent with age. No back pain reported, so no intervention required.  Plan - I personally reviewed her surveillance CT scan, and discussed findings with her in detail, including the incidental findings.  No evidence of recurrence. - She is clinically doing well, will continue surveillance. - Lab and follow-up in 6 months, plan to repeat a CT scan in 1 week   SUMMARY OF  ONCOLOGIC HISTORY: Oncology History Overview Note   Cancer Staging  Cancer of sigmoid colon [facility]) Staging  form: Colon and Rectum, AJCC 8th Edition - Pathologic stage from 07/09/2022: Stage IIA (pT3, pN0, cM0) - Signed by Sara Callander, MD on 07/28/2022 Total positive nodes: 0 Histologic grading system: 4 grade system Histologic grade (G): G1 Residual tumor (R): R0 - None     Cancer of sigmoid colon (HCC)  05/25/2022 Procedure   Colonoscopy by Dr. Glendia Holt - Likely malignant partially obstructing tumor in the recto-sigmoid colon. Biopsied. Tattooed. - One 14 mm polyp at the recto-sigmoid colon. This polyp was not removed due to it's very close proximity to the mass. - One 12 mm polyp in the ascending colon, removed with a cold snare. Resected and retrieved. - One 3 mm polyp in the descending colon, removed with a cold snare. Resected and retrieved. - The distal rectum and anal verge are normal on retroflexion view.   05/25/2022 Tumor Marker   CEA 3.4   06/02/2022 Imaging   CT CAP IMPRESSION: 1. Short segment asymmetric nodular wall thickening of the distal sigmoid colon/proximal rectum, likely reflecting patient's known primary colonic neoplasm. 2. No evidence of metastatic disease within the chest, abdomen or pelvis. 3. Nonobstructive punctate bilateral renal stones. 4. Enlarged uterus with multiple intrauterine masses measuring up to 5.1 cm, compatible with uterine leiomyomas.     06/03/2022 Initial Diagnosis   Rectosigmoid cancer (HCC)    Initial Biopsy   1. Colon, polyp(s), Ascending, Descending, x 2 - TUBULAR ADENOMA(S). - NO HIGH GRADE DYSPLASIA OR MALIGNANCY. 2. Rectosigmoid , biopsies - INVASIVE ADENOCARCINOMA, MODERATELY DIFFERENTIATED (SEE NOTE)   07/09/2022 Cancer Staging   Staging form: Colon and Rectum, AJCC 8th Edition - Pathologic stage from 07/09/2022: Stage IIA (pT3, pN0, cM0) - Signed by Sara Callander, MD on 07/28/2022 Total positive nodes: 0 Histologic grading system: 4 grade system Histologic grade (G): G1 Residual tumor (R): R0 - None   07/09/2022 Pathology  Results    FINAL MICROSCOPIC DIAGNOSIS:   A. COLON, RECTOSIGMOID, RESECTION:  -  Invasive well to moderately differentiated adenocarcinoma extending  through the mucosa, submucosa and into pericolonic soft tissue (6.1 cm  in greatest dimension)  -  Margins negative  pT3 pN0; see synoptic report for further information   B. FINAL DISTAL MARGIN:  -  Unremarkable colon, negative for malignancy.   C. UTERINE FIBROID, EXCISION:  -  Benign leiomyoma.      Discussed the use of AI scribe software for clinical note transcription with the patient, who gave verbal consent to proceed.  History of Present Illness Sara Dorsey is a 53 year old female with stage two colon cancer who presents for a pharmacy follow-up.  Her constipation is manageable with Miralax , which she takes regularly.  There is an increased size of a simple left upper pole renal cyst from 1.1 cm to 2.5 cm and a small left kidney stone. She is taking vitamin D .     REVIEW OF SYSTEMS:   Constitutional: Denies fevers, chills or abnormal weight loss Eyes: Denies blurriness of vision Ears, nose, mouth, throat, and face: Denies mucositis or sore throat Respiratory: Denies cough, dyspnea or wheezes Cardiovascular: Denies palpitation, chest discomfort or lower extremity swelling Gastrointestinal:  Denies nausea, heartburn or change in bowel habits Skin: Denies abnormal skin rashes Lymphatics: Denies new lymphadenopathy or easy bruising Neurological:Denies numbness, tingling or new weaknesses Behavioral/Psych: Mood is stable, no new changes  All other systems were reviewed with the  patient and are negative.  MEDICAL HISTORY:  Past Medical History:  Diagnosis Date   Hypertension     SURGICAL HISTORY: Past Surgical History:  Procedure Laterality Date   TUBAL LIGATION  2000    I have reviewed the social history and family history with the patient and they are unchanged from previous note.  ALLERGIES:  is  allergic to lisinopril .  MEDICATIONS:  Current Outpatient Medications  Medication Sig Dispense Refill   amLODipine  (NORVASC ) 2.5 MG tablet Take 1 tablet (2.5 mg total) by mouth at bedtime. 90 tablet 3   fluticasone  (FLONASE ) 50 MCG/ACT nasal spray Place 2 sprays into both nostrils daily. (Patient taking differently: Place 2 sprays into both nostrils daily as needed for allergies (Summer months).) 16 g 12   No current facility-administered medications for this visit.    PHYSICAL EXAMINATION: Not performed   LABORATORY DATA:  I have reviewed the data as listed    Latest Ref Rng & Units 07/02/2023    3:41 PM 11/23/2022    8:52 AM 07/11/2022    5:09 AM  CBC  WBC 4.0 - 10.5 K/uL 10.4  7.8  9.1   Hemoglobin 12.0 - 15.0 g/dL 87.4  87.3  89.6   Hematocrit 36.0 - 46.0 % 36.9  37.7  32.6   Platelets 150 - 400 K/uL 327  289  259         Latest Ref Rng & Units 07/21/2023   10:05 AM 07/02/2023    3:41 PM 11/23/2022    8:52 AM  CMP  Glucose 70 - 99 mg/dL 98  89  81   BUN 6 - 24 mg/dL 11  16  16    Creatinine 0.57 - 1.00 mg/dL 9.22  9.06  9.18   Sodium 134 - 144 mmol/L 141  139  141   Potassium 3.5 - 5.2 mmol/L 4.1  4.1  3.7   Chloride 96 - 106 mmol/L 104  104  108   CO2 20 - 29 mmol/L 19  27  28    Calcium 8.7 - 10.2 mg/dL 9.8  89.8  9.2   Total Protein 6.0 - 8.5 g/dL 7.6  8.4  7.9   Total Bilirubin 0.0 - 1.2 mg/dL 0.3  0.3  0.3   Alkaline Phos 44 - 121 IU/L 108  87  80   AST 0 - 40 IU/L 17  16  21    ALT 0 - 32 IU/L 16  13  16        RADIOGRAPHIC STUDIES: I have personally reviewed the radiological images as listed and agreed with the findings in the report. No results found.     I discussed the assessment and treatment plan with the patient. The patient was provided an opportunity to ask questions and all were answered. The patient agreed with the plan and demonstrated an understanding of the instructions.   The patient was advised to call back or seek an in-person evaluation  if the symptoms worsen or if the condition fails to improve as anticipated.  I provided 25 minutes of non face-to-face telephone visit time during this encounter, including review of chart and various tests results, discussions about plan of care and coordination of care plan.    Onita Mattock, MD 06/08/24

## 2024-06-08 NOTE — Assessment & Plan Note (Signed)
-  pT3N0M0, stage II, MSS, G1 -She was not interested in November 2023 by colonoscopy for intermittent hematochezia. -She underwent hemicolectomy on July 09, 2022.  I reviewed her surgical pathology, which showed well to moderately differentiated adenocarcinoma, T3, or 20 lymph nodes were negative, margins were negative, no lymphovascular invasion or perineural invasion. -We discussed that if she had a stage II colon cancer, does not have high risk features, so I do not recommend adjuvant chemotherapy. -We discussed her low to moderate risk of recurrence, especially in the first 2 to 3 years.  I recommend cancer surveillance with routine lab, including CEA, physical exam, and CT scan every year for 2 to 3 years.  She will have a repeat colonoscopy in January 2025. -We discussed the role of circulating tumor DNA in cancer surveillance, she would like to proceed, I ordered GuardantREveal to be done every 3 months X3
